# Patient Record
Sex: Male | Born: 1960 | Race: White | Hispanic: No | Marital: Single | Smoking: Former smoker
Health system: Southern US, Community
[De-identification: ages and names within clinical notes are randomized; demographics above are authoritative.]

## PROBLEM LIST (undated history)

## (undated) DIAGNOSIS — G4730 Sleep apnea, unspecified: Secondary | ICD-10-CM

## (undated) DIAGNOSIS — E785 Hyperlipidemia, unspecified: Secondary | ICD-10-CM

## (undated) DIAGNOSIS — E079 Disorder of thyroid, unspecified: Secondary | ICD-10-CM

## (undated) DIAGNOSIS — M549 Dorsalgia, unspecified: Secondary | ICD-10-CM

## (undated) DIAGNOSIS — I2699 Other pulmonary embolism without acute cor pulmonale: Secondary | ICD-10-CM

## (undated) DIAGNOSIS — I10 Essential (primary) hypertension: Secondary | ICD-10-CM

## (undated) DIAGNOSIS — F5101 Primary insomnia: Secondary | ICD-10-CM

## (undated) DIAGNOSIS — E213 Hyperparathyroidism, unspecified: Secondary | ICD-10-CM

## (undated) DIAGNOSIS — E119 Type 2 diabetes mellitus without complications: Secondary | ICD-10-CM

## (undated) DIAGNOSIS — Z8719 Personal history of other diseases of the digestive system: Secondary | ICD-10-CM

## (undated) DIAGNOSIS — Z8711 Personal history of peptic ulcer disease: Secondary | ICD-10-CM

## (undated) DIAGNOSIS — G894 Chronic pain syndrome: Secondary | ICD-10-CM

## (undated) HISTORY — DX: Essential (primary) hypertension: I10

## (undated) HISTORY — DX: Disorder of thyroid, unspecified: E07.9

## (undated) HISTORY — DX: Primary insomnia: F51.01

## (undated) HISTORY — DX: Sleep apnea, unspecified: G47.30

## (undated) HISTORY — DX: Chronic pain syndrome: G89.4

## (undated) HISTORY — PX: APPENDECTOMY: SHX54

## (undated) HISTORY — DX: Hyperparathyroidism, unspecified: E21.3

## (undated) HISTORY — PX: PARATHYROIDECTOMY: SHX19

## (undated) HISTORY — DX: Personal history of peptic ulcer disease: Z87.11

## (undated) HISTORY — PX: OTHER SURGICAL HISTORY: SHX169

## (undated) HISTORY — DX: Hyperlipidemia, unspecified: E78.5

## (undated) HISTORY — DX: Other pulmonary embolism without acute cor pulmonale: I26.99

## (undated) HISTORY — DX: Dorsalgia, unspecified: M54.9

## (undated) HISTORY — DX: Type 2 diabetes mellitus without complications: E11.9

---
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
## 1998-12-26 ENCOUNTER — Emergency Department (HOSPITAL_COMMUNITY): Admission: EM | Admit: 1998-12-26 | Discharge: 1998-12-26 | Payer: Self-pay | Admitting: Emergency Medicine

## 1998-12-26 ENCOUNTER — Encounter: Payer: Self-pay | Admitting: Emergency Medicine

## 2014-12-31 LAB — HM COLONOSCOPY

## 2018-09-11 DIAGNOSIS — R079 Chest pain, unspecified: Secondary | ICD-10-CM | POA: Diagnosis not present

## 2018-09-11 DIAGNOSIS — R0602 Shortness of breath: Secondary | ICD-10-CM

## 2018-09-14 ENCOUNTER — Ambulatory Visit (HOSPITAL_BASED_OUTPATIENT_CLINIC_OR_DEPARTMENT_OTHER)
Admission: RE | Admit: 2018-09-14 | Discharge: 2018-09-14 | Disposition: A | Discharge status: Home / Self Care | Payer: BLUE CROSS/BLUE SHIELD | Source: Ambulatory Visit | Attending: Cardiology | Admitting: Cardiology

## 2018-09-14 ENCOUNTER — Ambulatory Visit (INDEPENDENT_AMBULATORY_CARE_PROVIDER_SITE_OTHER): Payer: BLUE CROSS/BLUE SHIELD | Admitting: Cardiology

## 2018-09-14 DIAGNOSIS — E785 Hyperlipidemia, unspecified: Secondary | ICD-10-CM | POA: Insufficient documentation

## 2018-09-14 DIAGNOSIS — R9439 Abnormal result of other cardiovascular function study: Secondary | ICD-10-CM | POA: Diagnosis present

## 2018-09-14 DIAGNOSIS — I152 Hypertension secondary to endocrine disorders: Secondary | ICD-10-CM | POA: Insufficient documentation

## 2018-09-14 DIAGNOSIS — I10 Essential (primary) hypertension: Secondary | ICD-10-CM | POA: Diagnosis not present

## 2018-09-14 DIAGNOSIS — E119 Type 2 diabetes mellitus without complications: Secondary | ICD-10-CM | POA: Diagnosis not present

## 2018-09-14 NOTE — Progress Notes (Signed)
Cardiology Consultation:    Date:  09/14/2018   ID:  Sima Matas, DOB 1961-05-20, MRN [id number]  PCP:  Blane Ohara, MD  Cardiologist:  Gypsy Balsam, MD   Referring MD: Blane Ohara, MD   Chief Complaint  Patient presents with  . Abnormal Nuclear  I have abnormal stress test  History of Present Illness:    Russell Klein is a 57 y.o. male who is being seen today for the evaluation of abnormal stress test at the request of Cox, Kirsten, MD.  He has multiple risk factors for coronary artery disease that include hypertension diabetes dyslipidemia.  He is to smoke cigar however this past few years ago.  He did have a stress test with prompt stress test was the fact that he got short of breath very easily.  Stress test was done in form of Lexiscan and showed small area of ischemia involving midportion of the anterior wall.  Is here to talk about that.  He denies having any typical chest pain tightness squeezing pressure burning chest but gets tired very easily short of breath very easily.  Still is able to perform activity of daily living with no problems.  Never had any heart trouble.  Past Medical History:  Diagnosis Date  . Diabetes (HCC)   . Hyperlipidemia   . Hypertension   . Pulmonary embolism (HCC)   . Sleep apnea   . Thyroid disease       Current Medications: Current Meds  Medication Sig  . aspirin EC 81 MG tablet Take 81 mg by mouth daily.  Marland Kitchen atorvastatin (LIPITOR) 10 MG tablet Take 10 mg by mouth daily.  . Cholecalciferol (VITAMIN D3) 5000 units TABS Take 1 tablet by mouth daily.  Marland Kitchen FLUoxetine (PROZAC) 20 MG tablet Take 20 mg by mouth daily.  Marland Kitchen JARDIANCE 25 MG TABS tablet Take 1 tablet by mouth daily.  Marland Kitchen KOMBIGLYZE XR 2.03-999 MG TB24 Take 1 tablet by mouth 2 (two) times daily.  Marland Kitchen lisinopril (PRINIVIL,ZESTRIL) 20 MG tablet Take 20 mg by mouth daily.  . meloxicam (MOBIC) 15 MG tablet Take 15 mg by mouth daily.  . pantoprazole (PROTONIX) 40 MG tablet Take 40 mg by  mouth daily.  . pioglitazone (ACTOS) 15 MG tablet Take 15 mg by mouth daily.  . traZODone (DESYREL) 50 MG tablet Take 50 mg by mouth at bedtime.     Allergies:   Patient has no known allergies.   Social History   Socioeconomic History  . Marital status: Single    Spouse name: Not on file  . Number of children: Not on file  . Years of education: Not on file  . Highest education level: Not on file  Occupational History  . Not on file  Social Needs  . Financial resource strain: Not on file  . Food insecurity:    Worry: Not on file    Inability: Not on file  . Transportation needs:    Medical: Not on file    Non-medical: Not on file  Tobacco Use  . Smoking status: Former Smoker    Types: Cigars  . Smokeless tobacco: Never Used  Substance and Sexual Activity  . Alcohol use: Not Currently  . Drug use: Never  . Sexual activity: Not on file  Lifestyle  . Physical activity:    Days per week: Not on file    Minutes per session: Not on file  . Stress: Not on file  Relationships  . Social connections:  Talks on phone: Not on file    Gets together: Not on file    Attends religious service: Not on file    Active member of club or organization: Not on file    Attends meetings of clubs or organizations: Not on file    Relationship status: Not on file  Other Topics Concern  . Not on file  Social History Narrative  . Not on file     Family History: The patient's family history includes Cushing syndrome in his father; Diabetes in his mother; Stroke in his mother; Thyroid disease in his sister. ROS:   Please see the history of present illness.    All 14 point review of systems negative except as described per history of present illness.  EKGs/Labs/Other Studies Reviewed:    The following studies were reviewed today:  Stress test showed small region of mild reversible ischemia in the mid ventricle anterior wall left ventricular ejection fraction was 75% EKG:  EKG is   ordered today.  The ekg ordered today demonstrates   Recent Labs: No results found for requested labs within last 8760 hours.  Recent Lipid Panel No results found for: CHOL, TRIG, HDL, CHOLHDL, VLDL, LDLCALC, LDLDIRECT  Physical Exam:    VS:  BP (!) 156/72   Pulse 88   Resp 12   Ht 5\' 9"  (1.753 m)   Wt 296 lb 6.4 oz (134.4 kg)   BMI 43.77 kg/m     Wt Readings from Last 3 Encounters:  09/14/18 296 lb 6.4 oz (134.4 kg)     GEN:  Well nourished, well developed in no acute distress HEENT: Normal NECK: No JVD; No carotid bruits LYMPHATICS: No lymphadenopathy CARDIAC: RRR, no murmurs, no rubs, no gallops RESPIRATORY:  Clear to auscultation without rales, wheezing or rhonchi  ABDOMEN: Soft, non-tender, non-distended MUSCULOSKELETAL:  No edema; No deformity  SKIN: Warm and dry NEUROLOGIC:  Alert and oriented x 3 PSYCHIATRIC:  Normal affect   ASSESSMENT:    1. Abnormal stress test   2. Type 2 diabetes mellitus without complication, without long-term current use of insulin (HCC)   3. Essential hypertension   4. Dyslipidemia    PLAN:    In order of problems listed above:  1. Abnormal stress test.  We had a long discussion about what to do with the situation.  He clearly at high risk of having significant coronary artery disease his stress test showed only small area of ischemia but in his situation I recommended cardiac catheterization.  We also talked about options like medical therapy however with his atypical symptoms I would have difficult time to judge if medications are truly working on top of that taking into consideration all risk factors that he has the best way to assess his coronaries will be to perform cardiac catheterization.  Procedure was explained to him including all risk benefits as well as alternatives.  He agreed to proceed. 2. Type 2 diabetes followed by internal medicine team on appropriate medications including Jardiance will continue. 3. Essential  hypertension blood pressure well controlled continue present management. 4. Dyslipidemia he is taking statin however moderate intensity in the future he may require high intensity statin. 5. Snoring at night with stop breathing at night I strongly suspect he does have sleep apnea in the future most likely will require sleep study.   Medication Adjustments/Labs and Tests Ordered: Current medicines are reviewed at length with the patient today.  Concerns regarding medicines are outlined above.  No orders  of the defined types were placed in this encounter.  No orders of the defined types were placed in this encounter.   Signed, Georgeanna Lea, MD, [facility]. 09/14/2018 11:32 AM    [facility]

## 2018-09-14 NOTE — Patient Instructions (Addendum)
Medication Instructions:  Your physician recommends that you continue on your current medications as directed. Please refer to the Current Medication list given to you today.  If you need a refill on your cardiac medications before your next appointment, please call your pharmacy.   Lab work: Your physician recommends that you return for lab work today: CBC, BMP  If you have labs (blood work) drawn today and your tests are completely normal, you will receive your results only by: Marland Kitchen MyChart Message (if you have MyChart) OR . A paper copy in the mail If you have any lab test that is abnormal or we need to change your treatment, we will call you to review the results.  Testing/Procedures: A chest x-ray takes a picture of the organs and structures inside the chest, including the heart, lungs, and blood vessels. This test can show several things, including, whether the heart is enlarges; whether fluid is building up in the lungs; and whether pacemaker / defibrillator leads are still in place.     [facility] CARDIOVASCULAR DIVISION [facility] [place] [address], [address] [place] [address] Dept: [phone] Loc: [phone]  Syed Zukas  09/14/2018  You are scheduled for a Cardiac Catheterization on Tuesday, October 29 with Dr. Bryan Lemma.  1. Please arrive at the [facility] (Main Entrance A) at [facility]: [address] [place], [place] [address] at 7:00 AM (This time is two hours before your procedure to ensure your preparation). Free valet parking service is available.   Special note: Every effort is made to have your procedure done on time. Please understand that emergencies sometimes delay scheduled procedures.  2. Diet: Do not eat solid foods after midnight.  The patient may have clear liquids until 5am upon the day of the procedure.  3. Labs: You will need to have blood drawn today.  4. Medication instructions in  preparation for your procedure:   Contrast Allergy: No   Do not take Diabetes Med Kombligize XR on the day of the procedure and HOLD 48 HOURS AFTER THE PROCEDURE.   DO NOT TAKE Actos, and Jardiance on the day of the procedure.   On the morning of your procedure, take your Aspirin and any morning medicines NOT listed above.  You may use sips of water.  5. Plan for one night stay--bring personal belongings. 6. Bring a current list of your medications and current insurance cards. 7. You MUST have a responsible person to drive you home. 8. Someone MUST be with you the first 24 hours after you arrive home or your discharge will be delayed. 9. Please wear clothes that are easy to get on and off and wear slip-on shoes.  Thank you for allowing [place] to care for you!   -- [facility]  Follow-Up: At [facility], you and your health needs are our priority.  As part of our continuing mission to provide you with exceptional heart care, we have created designated Provider Care Teams.  These Care Teams include your primary Cardiologist (physician) and Advanced Practice Providers (APPs -  Physician Assistants and Nurse Practitioners) who all work together to provide you with the care you need, when you need it. You will need a follow up appointment in 1 months.  Please call our office 2 months in advance to schedule this appointment.  You may see No primary care provider on file. or another member of our [facility]  Team in [place]: Norman Herrlich, MD . Belva Crome, MD  Any Other Special Instructions Will Be Listed Below (If Applicable).   Chest X-Ray A chest X-ray is a painless test that uses radiation to create images of the structures inside of your chest. Chest X-rays are used to look for many health conditions, including heart failure, pneumonia, tuberculosis, rib fractures, breathing disorders, and cancer. They may be used to diagnose chest pain,  constant coughing, or trouble breathing. Tell a health care provider about:  Any allergies you have.  All medicines you are taking, including vitamins, herbs, eye drops, creams, and over-the-counter medicines.  Any surgeries you have had.  Any medical conditions you have.  Whether you are pregnant or may be pregnant. What are the risks? Getting a chest X-ray is a safe procedure. However, you will be exposed to a small amount of radiation. Being exposed to too much radiation over a lifetime can increase the risk of cancer. This risk is small, but it may occur if you have many X-rays throughout your life. What happens before the procedure?  You may be asked to remove glasses, jewelry, and any other metal objects.  You will be asked to undress from the waist up. You may be given a hospital gown to wear.  You may be asked to wear a protective lead apron to protect parts of your body from radiation. What happens during the procedure?  You will be asked to stand still as each picture is taken to get the best possible images.  You will be asked to take a deep breath and hold your breath for a few seconds.  The X-ray machine will create a picture of your chest using a tiny burst of radiation. This is painless.  More pictures may be taken from other angles. Typically, one picture will be taken while you face the X-ray camera, and another picture will be taken from the side while you stand. If you cannot stand, you may be asked to lie down. The procedure may vary among health care providers and hospitals. What happens after the procedure?  The X-ray(s) will be reviewed by your health care provider or an X-ray (radiology) specialist.  It is up to you to get your test results. Ask your health care provider, or the department that is doing the test, when your results will be ready.  Your health care provider will tell you if you need more tests or a follow-up exam. Keep all follow-up visits  as told by your health care provider. This is important. Summary  A chest X-ray is a safe, painless test that is used to examine the inside of the chest, heart, and lungs.  You will need to undress from the waist up and remove jewelry and metal objects before the procedure.  You will be exposed to a small amount of radiation during the procedure.  The X-ray machine will take one or more pictures of your chest while you remain as still as possible.  Later, a health care provider or specialist will review the test results with you. This information is not intended to replace advice given to you by your health care provider. Make sure you discuss any questions you have with your health care provider. Document Released: 01/03/2017 Document Revised: 01/03/2017 Document Reviewed: 01/03/2017 Elsevier Interactive Patient Education  2018 [employer].   Coronary Angiogram With Stent Coronary angiogram with stent placement is a procedure to widen or open a narrow blood vessel of the  heart (coronary artery). Arteries may become blocked by cholesterol buildup (plaques) in the lining of the wall. When a coronary artery becomes partially blocked, blood flow to that area decreases. This may lead to chest pain or a heart attack (myocardial infarction). A stent is a small piece of metal that looks like mesh or a spring. Stent placement may be done as treatment for a heart attack or right after a coronary angiogram in which a blocked artery is found. Let your health care provider know about:  Any allergies you have.  All medicines you are taking, including vitamins, herbs, eye drops, creams, and over-the-counter medicines.  Any problems you or family members have had with anesthetic medicines.  Any blood disorders you have.  Any surgeries you have had.  Any medical conditions you have.  Whether you are pregnant or may be pregnant. What are the risks? Generally, this is a safe procedure. However,  problems may occur, including:  Damage to the heart or its blood vessels.  A return of blockage.  Bleeding, infection, or bruising at the insertion site.  A collection of blood under the skin (hematoma) at the insertion site.  A blood clot in another part of the body.  Kidney injury.  Allergic reaction to the dye or contrast that is used.  Bleeding into the abdomen (retroperitoneal bleeding).  What happens before the procedure? Staying hydrated Follow instructions from your health care provider about hydration, which may include:  Up to 2 hours before the procedure - you may continue to drink clear liquids, such as water, clear fruit juice, black coffee, and plain tea.  Eating and drinking restrictions Follow instructions from your health care provider about eating and drinking, which may include:  8 hours before the procedure - stop eating heavy meals or foods such as meat, fried foods, or fatty foods.  6 hours before the procedure - stop eating light meals or foods, such as toast or cereal.  2 hours before the procedure - stop drinking clear liquids.  Ask your health care provider about:  Changing or stopping your regular medicines. This is especially important if you are taking diabetes medicines or blood thinners.  Taking medicines such as ibuprofen. These medicines can thin your blood. Do not take these medicines before your procedure if your health care provider instructs you not to. Generally, aspirin is recommended before a procedure of passing a small, thin tube (catheter) through a blood vessel and into the heart (cardiac catheterization).  What happens during the procedure?  An IV tube will be inserted into one of your veins.  You will be given one or more of the following: ? A medicine to help you relax (sedative). ? A medicine to numb the area where the catheter will be inserted into an artery (local anesthetic).  To reduce your risk of infection: ? Your  health care team will wash or sanitize their hands. ? Your skin will be washed with soap. ? Hair may be removed from the area where the catheter will be inserted.  Using a guide wire, the catheter will be inserted into an artery. The location may be in your groin, in your wrist, or in the fold of your arm (near your elbow).  A type of X-ray (fluoroscopy) will be used to help guide the catheter to the opening of the arteries in the heart.  A dye will be injected into the catheter, and X-rays will be taken. The dye will help to show where any  narrowing or blockages are located in the arteries.  A tiny wire will be guided to the blocked spot, and a balloon will be inflated to make the artery wider.  The stent will be expanded and will crush the plaques into the wall of the vessel. The stent will hold the area open and improve the blood flow. Most stents have a drug coating to reduce the risk of the stent narrowing over time.  The artery may be made wider using a drill, laser, or other tools to remove plaques.  When the blood flow is better, the catheter will be removed. The lining of the artery will grow over the stent, which stays where it was placed. This procedure may vary among health care providers and hospitals. What happens after the procedure?  If the procedure is done through the leg, you will be kept in bed lying flat for about 6 hours. You will be instructed to not bend and not cross your legs.  The insertion site will be checked frequently.  The pulse in your foot or wrist will be checked frequently.  You may have additional blood tests, X-rays, and a test that records the electrical activity of your heart (electrocardiogram, or ECG). This information is not intended to replace advice given to you by your health care provider. Make sure you discuss any questions you have with your health care provider. Document Released: 05/14/2003 Document Revised: 07/07/2016 Document Reviewed:  06/12/2016 Elsevier Interactive Patient Education  [employer].

## 2018-09-14 NOTE — H&P (View-Only) (Signed)
 Cardiology Consultation:    Date:  09/14/2018   ID:  Russell Klein, DOB 08/02/1961, MRN [id number]  PCP:  Cox, Kirsten, MD  Cardiologist:  Robert Krasowski, MD   Referring MD: Cox, Kirsten, MD   Chief Complaint  Patient presents with  . Abnormal Nuclear  I have abnormal stress test  History of Present Illness:    Russell Klein is a 57 y.o. male who is being seen today for the evaluation of abnormal stress test at the request of Cox, Kirsten, MD.  He has multiple risk factors for coronary artery disease that include hypertension diabetes dyslipidemia.  He is to smoke cigar however this past few years ago.  He did have a stress test with prompt stress test was the fact that he got short of breath very easily.  Stress test was done in form of Lexiscan and showed small area of ischemia involving midportion of the anterior wall.  Is here to talk about that.  He denies having any typical chest pain tightness squeezing pressure burning chest but gets tired very easily short of breath very easily.  Still is able to perform activity of daily living with no problems.  Never had any heart trouble.  Past Medical History:  Diagnosis Date  . Diabetes (HCC)   . Hyperlipidemia   . Hypertension   . Pulmonary embolism (HCC)   . Sleep apnea   . Thyroid disease       Current Medications: Current Meds  Medication Sig  . aspirin EC 81 MG tablet Take 81 mg by mouth daily.  . atorvastatin (LIPITOR) 10 MG tablet Take 10 mg by mouth daily.  . Cholecalciferol (VITAMIN D3) 5000 units TABS Take 1 tablet by mouth daily.  . FLUoxetine (PROZAC) 20 MG tablet Take 20 mg by mouth daily.  . JARDIANCE 25 MG TABS tablet Take 1 tablet by mouth daily.  . KOMBIGLYZE XR 2.03-999 MG TB24 Take 1 tablet by mouth 2 (two) times daily.  . lisinopril (PRINIVIL,ZESTRIL) 20 MG tablet Take 20 mg by mouth daily.  . meloxicam (MOBIC) 15 MG tablet Take 15 mg by mouth daily.  . pantoprazole (PROTONIX) 40 MG tablet Take 40 mg by  mouth daily.  . pioglitazone (ACTOS) 15 MG tablet Take 15 mg by mouth daily.  . traZODone (DESYREL) 50 MG tablet Take 50 mg by mouth at bedtime.     Allergies:   Patient has no known allergies.   Social History   Socioeconomic History  . Marital status: Single    Spouse name: Not on file  . Number of children: Not on file  . Years of education: Not on file  . Highest education level: Not on file  Occupational History  . Not on file  Social Needs  . Financial resource strain: Not on file  . Food insecurity:    Worry: Not on file    Inability: Not on file  . Transportation needs:    Medical: Not on file    Non-medical: Not on file  Tobacco Use  . Smoking status: Former Smoker    Types: Cigars  . Smokeless tobacco: Never Used  Substance and Sexual Activity  . Alcohol use: Not Currently  . Drug use: Never  . Sexual activity: Not on file  Lifestyle  . Physical activity:    Days per week: Not on file    Minutes per session: Not on file  . Stress: Not on file  Relationships  . Social connections:      Talks on phone: Not on file    Gets together: Not on file    Attends religious service: Not on file    Active member of club or organization: Not on file    Attends meetings of clubs or organizations: Not on file    Relationship status: Not on file  Other Topics Concern  . Not on file  Social History Narrative  . Not on file     Family History: The patient's family history includes Cushing syndrome in his father; Diabetes in his mother; Stroke in his mother; Thyroid disease in his sister. ROS:   Please see the history of present illness.    All 14 point review of systems negative except as described per history of present illness.  EKGs/Labs/Other Studies Reviewed:    The following studies were reviewed today:  Stress test showed small region of mild reversible ischemia in the mid ventricle anterior wall left ventricular ejection fraction was 75% EKG:  EKG is   ordered today.  The ekg ordered today demonstrates   Recent Labs: No results found for requested labs within last 8760 hours.  Recent Lipid Panel No results found for: CHOL, TRIG, HDL, CHOLHDL, VLDL, LDLCALC, LDLDIRECT  Physical Exam:    VS:  BP (!) 156/72   Pulse 88   Resp 12   Ht 5' 9" (1.753 m)   Wt 296 lb 6.4 oz (134.4 kg)   BMI 43.77 kg/m     Wt Readings from Last 3 Encounters:  09/14/18 296 lb 6.4 oz (134.4 kg)     GEN:  Well nourished, well developed in no acute distress HEENT: Normal NECK: No JVD; No carotid bruits LYMPHATICS: No lymphadenopathy CARDIAC: RRR, no murmurs, no rubs, no gallops RESPIRATORY:  Clear to auscultation without rales, wheezing or rhonchi  ABDOMEN: Soft, non-tender, non-distended MUSCULOSKELETAL:  No edema; No deformity  SKIN: Warm and dry NEUROLOGIC:  Alert and oriented x 3 PSYCHIATRIC:  Normal affect   ASSESSMENT:    1. Abnormal stress test   2. Type 2 diabetes mellitus without complication, without long-term current use of insulin (HCC)   3. Essential hypertension   4. Dyslipidemia    PLAN:    In order of problems listed above:  1. Abnormal stress test.  We had a long discussion about what to do with the situation.  He clearly at high risk of having significant coronary artery disease his stress test showed only small area of ischemia but in his situation I recommended cardiac catheterization.  We also talked about options like medical therapy however with his atypical symptoms I would have difficult time to judge if medications are truly working on top of that taking into consideration all risk factors that he has the best way to assess his coronaries will be to perform cardiac catheterization.  Procedure was explained to him including all risk benefits as well as alternatives.  He agreed to proceed. 2. Type 2 diabetes followed by internal medicine team on appropriate medications including Jardiance will continue. 3. Essential  hypertension blood pressure well controlled continue present management. 4. Dyslipidemia he is taking statin however moderate intensity in the future he may require high intensity statin. 5. Snoring at night with stop breathing at night I strongly suspect he does have sleep apnea in the future most likely will require sleep study.   Medication Adjustments/Labs and Tests Ordered: Current medicines are reviewed at length with the patient today.  Concerns regarding medicines are outlined above.  No orders   of the defined types were placed in this encounter.  No orders of the defined types were placed in this encounter.   Signed, Robert J. Krasowski, MD, FACC. 09/14/2018 11:32 AM    [facility] 

## 2018-09-15 LAB — BASIC METABOLIC PANEL
BUN / CREAT RATIO: 17 (ref 9–20)
BUN: 16 mg/dL (ref 6–24)
CHLORIDE: 106 mmol/L (ref 96–106)
CO2: 22 mmol/L (ref 20–29)
Calcium: 10.6 mg/dL — ABNORMAL HIGH (ref 8.7–10.2)
Creatinine, Ser: 0.92 mg/dL (ref 0.76–1.27)
GFR calc non Af Amer: 93 mL/min/{1.73_m2} (ref >59)
GFR, EST AFRICAN AMERICAN: 107 mL/min/{1.73_m2} (ref >59)
GLUCOSE: 128 mg/dL — AB (ref 65–99)
POTASSIUM: 4.6 mmol/L (ref 3.5–5.2)
SODIUM: 142 mmol/L (ref 134–144)

## 2018-09-15 LAB — CBC
Hematocrit: 43.7 % (ref 37.5–51.0)
Hemoglobin: 14.9 g/dL (ref 13.0–17.7)
MCH: 30.8 pg (ref 26.6–33.0)
MCHC: 34.1 g/dL (ref 31.5–35.7)
MCV: 91 fL (ref 79–97)
PLATELETS: 292 10*3/uL (ref 150–450)
RBC: 4.83 x10E6/uL (ref 4.14–5.80)
RDW: 15.2 % (ref 12.3–15.4)
WBC: 9.1 10*3/uL (ref 3.4–10.8)

## 2018-09-17 ENCOUNTER — Telehealth: Payer: Self-pay | Admitting: *Deleted

## 2018-09-17 DIAGNOSIS — R0600 Dyspnea, unspecified: Secondary | ICD-10-CM | POA: Clinically undetermined

## 2018-09-17 DIAGNOSIS — R0609 Other forms of dyspnea: Secondary | ICD-10-CM | POA: Clinically undetermined

## 2018-09-17 NOTE — Telephone Encounter (Signed)
Pt contacted pre-catheterization scheduled at [facility] for: Tuesday September 18, 2018 9 AM  Verified arrival time and place: [facility] Main Entrance A at: 7 AM  No solid food after midnight prior to cath, clear liquids until 5 AM day of procedure. Contrast allergy: no  Hold: Kombiglyze-day of procedure and 48 hours post procedure.  Actos-AM of procedure. Jardiance -AM of procedure.  Except hold medications AM meds can be  taken pre-cath with sip of water including: ASA 81 mg  Confirmed patient has responsible person to drive home post procedure and for 24 hours after you arrive home: yes

## 2018-09-18 ENCOUNTER — Encounter (HOSPITAL_COMMUNITY)
Admission: RE | Disposition: A | Discharge status: Home / Self Care | Payer: Self-pay | Source: Ambulatory Visit | Attending: Cardiology

## 2018-09-18 ENCOUNTER — Encounter (HOSPITAL_COMMUNITY): Payer: Self-pay | Admitting: Cardiology

## 2018-09-18 ENCOUNTER — Ambulatory Visit (HOSPITAL_COMMUNITY)
Admission: RE | Admit: 2018-09-18 | Discharge: 2018-09-18 | Disposition: A | Discharge status: Home / Self Care | Payer: BLUE CROSS/BLUE SHIELD | Source: Ambulatory Visit | Attending: Cardiology | Admitting: Cardiology

## 2018-09-18 DIAGNOSIS — I471 Supraventricular tachycardia: Secondary | ICD-10-CM | POA: Diagnosis not present

## 2018-09-18 DIAGNOSIS — E119 Type 2 diabetes mellitus without complications: Secondary | ICD-10-CM

## 2018-09-18 DIAGNOSIS — G4730 Sleep apnea, unspecified: Secondary | ICD-10-CM | POA: Insufficient documentation

## 2018-09-18 DIAGNOSIS — F17290 Nicotine dependence, other tobacco product, uncomplicated: Secondary | ICD-10-CM | POA: Diagnosis not present

## 2018-09-18 DIAGNOSIS — E785 Hyperlipidemia, unspecified: Secondary | ICD-10-CM | POA: Insufficient documentation

## 2018-09-18 DIAGNOSIS — Z86711 Personal history of pulmonary embolism: Secondary | ICD-10-CM | POA: Diagnosis not present

## 2018-09-18 DIAGNOSIS — E1159 Type 2 diabetes mellitus with other circulatory complications: Secondary | ICD-10-CM | POA: Diagnosis present

## 2018-09-18 DIAGNOSIS — R9439 Abnormal result of other cardiovascular function study: Secondary | ICD-10-CM | POA: Insufficient documentation

## 2018-09-18 DIAGNOSIS — Z7982 Long term (current) use of aspirin: Secondary | ICD-10-CM | POA: Insufficient documentation

## 2018-09-18 DIAGNOSIS — I97790 Other intraoperative cardiac functional disturbances during cardiac surgery: Secondary | ICD-10-CM | POA: Diagnosis not present

## 2018-09-18 DIAGNOSIS — R0609 Other forms of dyspnea: Secondary | ICD-10-CM

## 2018-09-18 DIAGNOSIS — I10 Essential (primary) hypertension: Secondary | ICD-10-CM | POA: Diagnosis present

## 2018-09-18 DIAGNOSIS — R0600 Dyspnea, unspecified: Secondary | ICD-10-CM | POA: Clinically undetermined

## 2018-09-18 DIAGNOSIS — I152 Hypertension secondary to endocrine disorders: Secondary | ICD-10-CM | POA: Diagnosis present

## 2018-09-18 HISTORY — PX: LEFT HEART CATH AND CORONARY ANGIOGRAPHY: CATH118249

## 2018-09-18 LAB — GLUCOSE, CAPILLARY: Glucose-Capillary: 140 mg/dL — ABNORMAL HIGH (ref 70–99)

## 2018-09-18 SURGERY — LEFT HEART CATH AND CORONARY ANGIOGRAPHY
Anesthesia: LOCAL

## 2018-09-18 MED ORDER — MIDAZOLAM HCL 2 MG/2ML IJ SOLN
INTRAMUSCULAR | Status: DC | PRN
  Administered 2018-09-18: 1 mg via INTRAVENOUS

## 2018-09-18 MED ORDER — HEPARIN (PORCINE) IN NACL 1000-0.9 UT/500ML-% IV SOLN
INTRAVENOUS | Status: DC | PRN
  Administered 2018-09-18 (×2): 500 mL

## 2018-09-18 MED ORDER — LIDOCAINE HCL (PF) 1 % IJ SOLN
INTRAMUSCULAR | Status: AC
  Filled 2018-09-18: qty 30

## 2018-09-18 MED ORDER — SODIUM CHLORIDE 0.9 % IV SOLN: 250.0000 mL | INTRAVENOUS | Status: DC | PRN

## 2018-09-18 MED ORDER — SODIUM CHLORIDE 0.9% FLUSH: 3.0000 mL | INTRAVENOUS | Status: DC | PRN

## 2018-09-18 MED ORDER — ACETAMINOPHEN 325 MG PO TABS: 650.0000 mg | ORAL_TABLET | ORAL | Status: DC | PRN

## 2018-09-18 MED ORDER — SODIUM CHLORIDE 0.9 % WEIGHT BASED INFUSION: 1.0000 mL/kg/h | INTRAVENOUS | Status: DC

## 2018-09-18 MED ORDER — SODIUM CHLORIDE 0.9% FLUSH: 3.0000 mL | Freq: Two times a day (BID) | INTRAVENOUS | Status: DC

## 2018-09-18 MED ORDER — MIDAZOLAM HCL 2 MG/2ML IJ SOLN
INTRAMUSCULAR | Status: AC
  Filled 2018-09-18: qty 2

## 2018-09-18 MED ORDER — HEPARIN (PORCINE) IN NACL 1000-0.9 UT/500ML-% IV SOLN
INTRAVENOUS | Status: AC
  Filled 2018-09-18: qty 1000

## 2018-09-18 MED ORDER — NITROGLYCERIN 1 MG/10 ML FOR IR/CATH LAB
INTRA_ARTERIAL | Status: DC | PRN
  Administered 2018-09-18: 200 ug via INTRACORONARY

## 2018-09-18 MED ORDER — VERAPAMIL HCL 2.5 MG/ML IV SOLN
INTRAVENOUS | Status: DC | PRN
  Administered 2018-09-18: 10 mL via INTRA_ARTERIAL

## 2018-09-18 MED ORDER — FENTANYL CITRATE (PF) 100 MCG/2ML IJ SOLN
INTRAMUSCULAR | Status: DC | PRN
  Administered 2018-09-18: 25 ug via INTRAVENOUS

## 2018-09-18 MED ORDER — ONDANSETRON HCL 4 MG/2ML IJ SOLN: 4.0000 mg | Freq: Four times a day (QID) | INTRAMUSCULAR | Status: DC | PRN

## 2018-09-18 MED ORDER — HEPARIN SODIUM (PORCINE) 1000 UNIT/ML IJ SOLN
INTRAMUSCULAR | Status: DC | PRN
  Administered 2018-09-18: 6000 [IU] via INTRAVENOUS

## 2018-09-18 MED ORDER — VERAPAMIL HCL 2.5 MG/ML IV SOLN
INTRAVENOUS | Status: AC
  Filled 2018-09-18: qty 2

## 2018-09-18 MED ORDER — ASPIRIN 81 MG PO CHEW: 81.0000 mg | CHEWABLE_TABLET | ORAL | Status: DC

## 2018-09-18 MED ORDER — NITROGLYCERIN 1 MG/10 ML FOR IR/CATH LAB
INTRA_ARTERIAL | Status: AC
  Filled 2018-09-18: qty 10

## 2018-09-18 MED ORDER — FENTANYL CITRATE (PF) 100 MCG/2ML IJ SOLN
INTRAMUSCULAR | Status: AC
  Filled 2018-09-18: qty 2

## 2018-09-18 MED ORDER — SODIUM CHLORIDE 0.9 % IV SOLN: INTRAVENOUS | Status: DC

## 2018-09-18 MED ORDER — IOHEXOL 350 MG/ML SOLN
INTRAVENOUS | Status: DC | PRN
  Administered 2018-09-18: 140 mL

## 2018-09-18 MED ORDER — SODIUM CHLORIDE 0.9 % WEIGHT BASED INFUSION
3.0000 mL/kg/h | INTRAVENOUS | Status: AC
  Administered 2018-09-18: 3 mL/kg/h via INTRAVENOUS

## 2018-09-18 MED ORDER — LIDOCAINE HCL (PF) 1 % IJ SOLN
INTRAMUSCULAR | Status: DC | PRN
  Administered 2018-09-18: 2 mL

## 2018-09-18 SURGICAL SUPPLY — 15 items
CATH INFINITI 5FR ANG PIGTAIL (CATHETERS) ×1 IMPLANT
CATH LAUNCHER 5F 3DRIGHT (CATHETERS) IMPLANT
CATH LAUNCHER 5F EBU3.5 (CATHETERS) ×1 IMPLANT
CATH OPTITORQUE TIG 4.0 5F (CATHETERS) ×1 IMPLANT
CATHETER LAUNCHER 5F 3DRIGHT (CATHETERS) ×2
DEVICE RAD COMP TR BAND LRG (VASCULAR PRODUCTS) ×1 IMPLANT
GLIDESHEATH SLEND A-KIT 6F 22G (SHEATH) ×1 IMPLANT
GLIDESHEATH SLEND SS 6F .021 (SHEATH) IMPLANT
GUIDEWIRE INQWIRE 1.5J.035X260 (WIRE) IMPLANT
INQWIRE 1.5J .035X260CM (WIRE) ×2
KIT HEART LEFT (KITS) ×2 IMPLANT
PACK CARDIAC CATHETERIZATION (CUSTOM PROCEDURE TRAY) ×2 IMPLANT
SYR MEDRAD MARK V 150ML (SYRINGE) ×2 IMPLANT
TRANSDUCER W/STOPCOCK (MISCELLANEOUS) ×2 IMPLANT
TUBING CIL FLEX 10 FLL-RA (TUBING) ×2 IMPLANT

## 2018-09-18 NOTE — Interval H&P Note (Signed)
History and Physical Interval Note:  09/18/2018 9:09 AM  Russell Klein  has presented today for surgery, with the diagnosis of ABNORMAL STRESS TEST.  The various methods of treatment have been discussed with the patient and family. After consideration of risks, benefits and other options for treatment, the patient has consented to  Procedure(s): LEFT HEART CATH AND CORONARY ANGIOGRAPHY (N/A) with possible PERCUTANEOUS CORONARY INTERVENTION as a surgical intervention .    The patient's history has been reviewed, patient examined, no change in status, stable for surgery.  I have reviewed the patient's chart and labs.  Questions were answered to the patient's satisfaction.    Cath Lab Visit (complete for each Cath Lab visit)  Clinical Evaluation Leading to the Procedure:   ACS: No.  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

## 2018-09-18 NOTE — Discharge Instructions (Signed)

## 2018-10-16 ENCOUNTER — Ambulatory Visit: Payer: BLUE CROSS/BLUE SHIELD | Admitting: Cardiology

## 2018-10-16 ENCOUNTER — Encounter: Payer: Self-pay | Admitting: Cardiology

## 2018-10-16 VITALS — BP 126/64 | HR 68 | Ht 69.0 in | Wt 297.1 lb

## 2018-10-16 DIAGNOSIS — E119 Type 2 diabetes mellitus without complications: Secondary | ICD-10-CM

## 2018-10-16 DIAGNOSIS — R9439 Abnormal result of other cardiovascular function study: Secondary | ICD-10-CM | POA: Diagnosis not present

## 2018-10-16 DIAGNOSIS — E785 Hyperlipidemia, unspecified: Secondary | ICD-10-CM

## 2018-10-16 DIAGNOSIS — I10 Essential (primary) hypertension: Secondary | ICD-10-CM | POA: Diagnosis not present

## 2018-10-16 NOTE — Progress Notes (Signed)
Cardiology Office Note:    Date:  10/16/2018   ID:  Russell Klein, DOB 03-18-1961, MRN [id number]  PCP:  Blane Ohara, MD  Cardiologist:  Gypsy Balsam, MD    Referring MD: Blane Ohara, MD   Chief Complaint  Patient presents with  . 1 month follow up  Doing great  History of Present Illness:    Russell Klein is a 57 y.o. male who was referred to me because of mildly abnormal stress test.  Because of his significant risk factors namely diabetes hypertension dyslipidemia he was referred for cardiac catheterization cardiac catheterization was done and showed normal coronaries.  Overall he is doing well obviously very happy with the results of his cardiac catheterization denies have any chest pain tightness squeezing pressure burning chest shortness of breath with exertion such as usual I suspect this problem is multifactorial clearly weight play some significant role here.  He also does have some diastolic dysfunction.  Past Medical History:  Diagnosis Date  . Diabetes (HCC)   . Hyperlipidemia   . Hypertension   . Pulmonary embolism (HCC)   . Sleep apnea   . Thyroid disease       Current Medications: Current Meds  Medication Sig  . aspirin EC 81 MG tablet Take 81 mg by mouth daily.  Marland Kitchen atorvastatin (LIPITOR) 10 MG tablet Take 10 mg by mouth daily.  . Cholecalciferol (VITAMIN D3) 5000 units TABS Take 1 tablet by mouth daily.  Marland Kitchen FLUoxetine (PROZAC) 20 MG tablet Take 20 mg by mouth daily.  Marland Kitchen JARDIANCE 25 MG TABS tablet Take 1 tablet by mouth daily.  Marland Kitchen KOMBIGLYZE XR 2.03-999 MG TB24 Take 1 tablet by mouth 2 (two) times daily.  Marland Kitchen lisinopril (PRINIVIL,ZESTRIL) 20 MG tablet Take 20 mg by mouth daily.  . meloxicam (MOBIC) 15 MG tablet Take 15 mg by mouth daily.  . nitroGLYCERIN (NITROSTAT) 0.4 MG SL tablet Place 0.4 mg under the tongue every 5 (five) minutes as needed for chest pain.   . pantoprazole (PROTONIX) 40 MG tablet Take 40 mg by mouth daily.  . pioglitazone (ACTOS) 15 MG  tablet Take 15 mg by mouth daily.  . traZODone (DESYREL) 50 MG tablet Take 50 mg by mouth at bedtime.     Allergies:   Patient has no known allergies.   Social History   Socioeconomic History  . Marital status: Single    Spouse name: Not on file  . Number of children: Not on file  . Years of education: Not on file  . Highest education level: Not on file  Occupational History  . Not on file  Social Needs  . Financial resource strain: Not on file  . Food insecurity:    Worry: Not on file    Inability: Not on file  . Transportation needs:    Medical: Not on file    Non-medical: Not on file  Tobacco Use  . Smoking status: Former Smoker    Types: Cigars  . Smokeless tobacco: Never Used  Substance and Sexual Activity  . Alcohol use: Not Currently  . Drug use: Never  . Sexual activity: Not on file  Lifestyle  . Physical activity:    Days per week: Not on file    Minutes per session: Not on file  . Stress: Not on file  Relationships  . Social connections:    Talks on phone: Not on file    Gets together: Not on file    Attends religious service: Not on file  Active member of club or organization: Not on file    Attends meetings of clubs or organizations: Not on file    Relationship status: Not on file  Other Topics Concern  . Not on file  Social History Narrative  . Not on file     Family History: The patient's family history includes Cushing syndrome in his father; Diabetes in his mother; Stroke in his mother; Thyroid disease in his sister. ROS:   Please see the history of present illness.    All 14 point review of systems negative except as described per history of present illness  EKGs/Labs/Other Studies Reviewed:     Cardiac catheterization 09/01/2018  The left ventricular systolic function is normal. The left ventricular ejection fraction is 55-65% by visual estimate.  LV end diastolic pressure is moderately elevated.  Angiographically normal coronary  arteries with codominant system.     Recent Labs: 09/14/2018: BUN 16; Creatinine, Ser 0.92; Hemoglobin 14.9; Platelets 292; Potassium 4.6; Sodium 142  Recent Lipid Panel No results found for: CHOL, TRIG, HDL, CHOLHDL, VLDL, LDLCALC, LDLDIRECT  Physical Exam:    VS:  BP 126/64   Pulse 68   Ht 5\' 9"  (1.753 m)   Wt 297 lb 1.9 oz (134.8 kg)   SpO2 96%   BMI 43.88 kg/m     Wt Readings from Last 3 Encounters:  10/16/18 297 lb 1.9 oz (134.8 kg)  09/18/18 295 lb (133.8 kg)  09/14/18 296 lb 6.4 oz (134.4 kg)     GEN:  Well nourished, well developed in no acute distress HEENT: Normal NECK: No JVD; No carotid bruits LYMPHATICS: No lymphadenopathy CARDIAC: RRR, no murmurs, no rubs, no gallops RESPIRATORY:  Clear to auscultation without rales, wheezing or rhonchi  ABDOMEN: Soft, non-tender, non-distended MUSCULOSKELETAL:  No edema; No deformity  SKIN: Warm and dry LOWER EXTREMITIES: no swelling NEUROLOGIC:  Alert and oriented x 3 PSYCHIATRIC:  Normal affect   ASSESSMENT:    1. Abnormal stress test   2. Essential hypertension   3. Type 2 diabetes mellitus without complication, without long-term current use of insulin (HCC)   4. Dyslipidemia    PLAN:    In order of problems listed above:  1. Abnormal stress test with cardiac catheterization showing normal coronaries the key will be now to modify risk factors for coronary artery disease I review all medications with him he will continue taking aspirin and statin as well as all appropriate medication for his diabetes. 2. Essential hypertension blood pressure well controlled continue present management. 3. Dyslipidemia he is on statin which we will continue.  His last fasting lipid profile is acceptable. 4. Type 2 diabetes on appropriate medications which I will continue. 5.  Diastolic dysfunction.  He appears to be asymptomatic right now that became a problem in the future he may require some diuresis.  Medication  Adjustments/Labs and Tests Ordered: Current medicines are reviewed at length with the patient today.  Concerns regarding medicines are outlined above.  No orders of the defined types were placed in this encounter.  Medication changes: No orders of the defined types were placed in this encounter.   Signed, Georgeanna Leaobert J. Krasowski, MD, [facility] 10/16/2018 2:14 PM    [facility]

## 2018-10-16 NOTE — Patient Instructions (Signed)
Medication Instructions:  Your physician recommends that you continue on your current medications as directed. Please refer to the Current Medication list given to you today.  If you need a refill on your cardiac medications before your next appointment, please call your pharmacy.   Lab work: None ordred If you have labs (blood work) drawn today and your tests are completely normal, you will receive your results only by: Marland Kitchen. MyChart Message (if you have MyChart) OR . A paper copy in the mail If you have any lab test that is abnormal or we need to change your treatment, we will call you to review the results.  Testing/Procedures: None ordered  Follow-Up: At [facility], you and your health needs are our priority.  As part of our continuing mission to provide you with exceptional heart care, we have created designated Provider Care Teams.  These Care Teams include your primary Cardiologist (physician) and Advanced Practice Providers (APPs -  Physician Assistants and Nurse Practitioners) who all work together to provide you with the care you need, when you need it. You will need a follow up appointment in 6 months.  Please call our office 2 months in advance to schedule this appointment.  You may see Gypsy Balsamobert Krasowski, MD or another member of our [facility] Provider Team in [place]: Norman HerrlichBrian Munley, MD . Belva Cromeajan Revankar, MD  Any Other Special Instructions Will Be Listed Below (If Applicable).

## 2018-12-05 DIAGNOSIS — E559 Vitamin D deficiency, unspecified: Secondary | ICD-10-CM | POA: Insufficient documentation

## 2018-12-05 DIAGNOSIS — E213 Hyperparathyroidism, unspecified: Secondary | ICD-10-CM | POA: Insufficient documentation

## 2019-12-03 LAB — PSA: PSA: 0.6

## 2019-12-18 IMAGING — CR DG CHEST 2V
2 series · 2 of 2 positions shown · non-contrast
Comparison: PA and lateral chest 04/18/2014.

CLINICAL DATA: Chest pain and shortness of breath for a few weeks.

EXAM:
CHEST - 2 VIEW

[w chest pa]
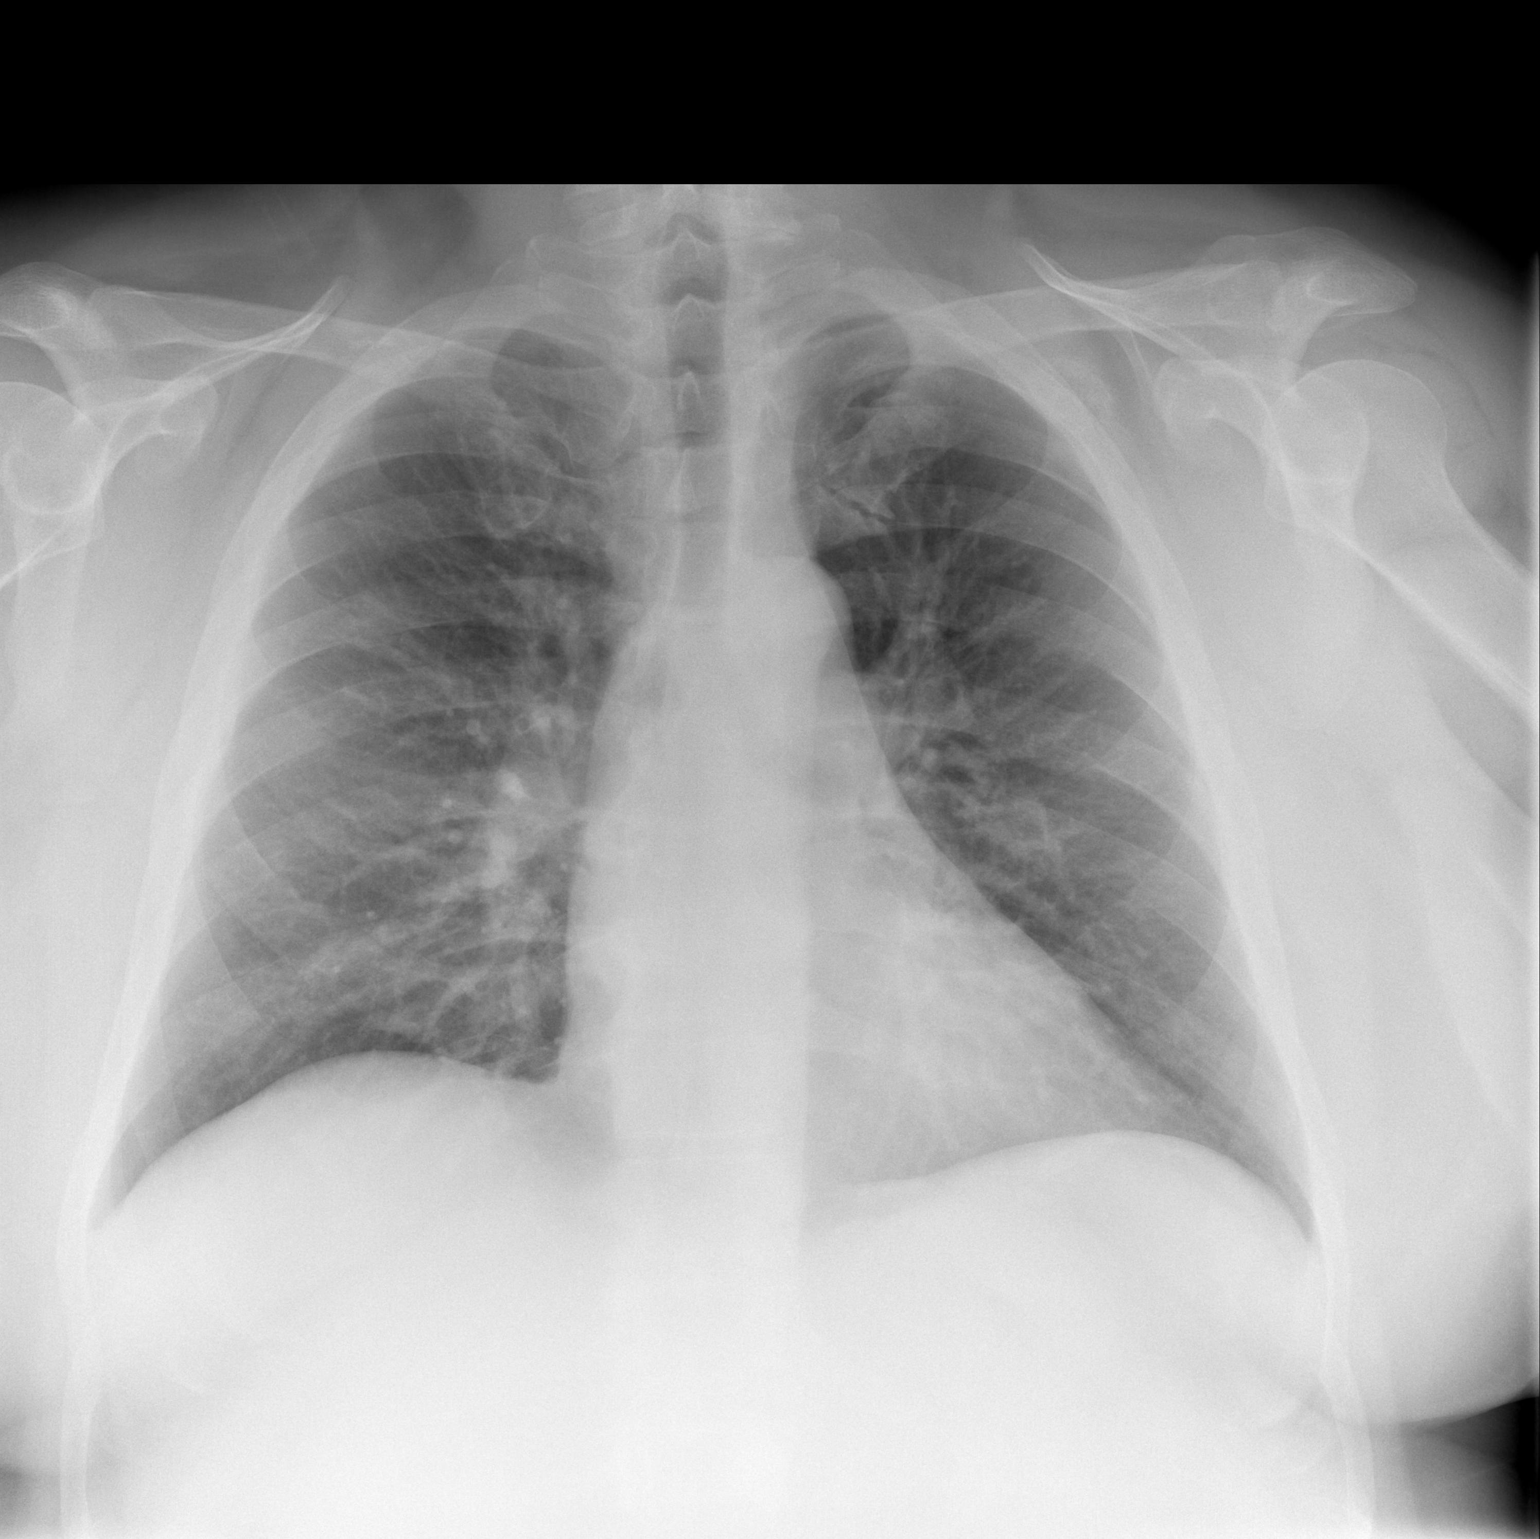

[w chest lat]
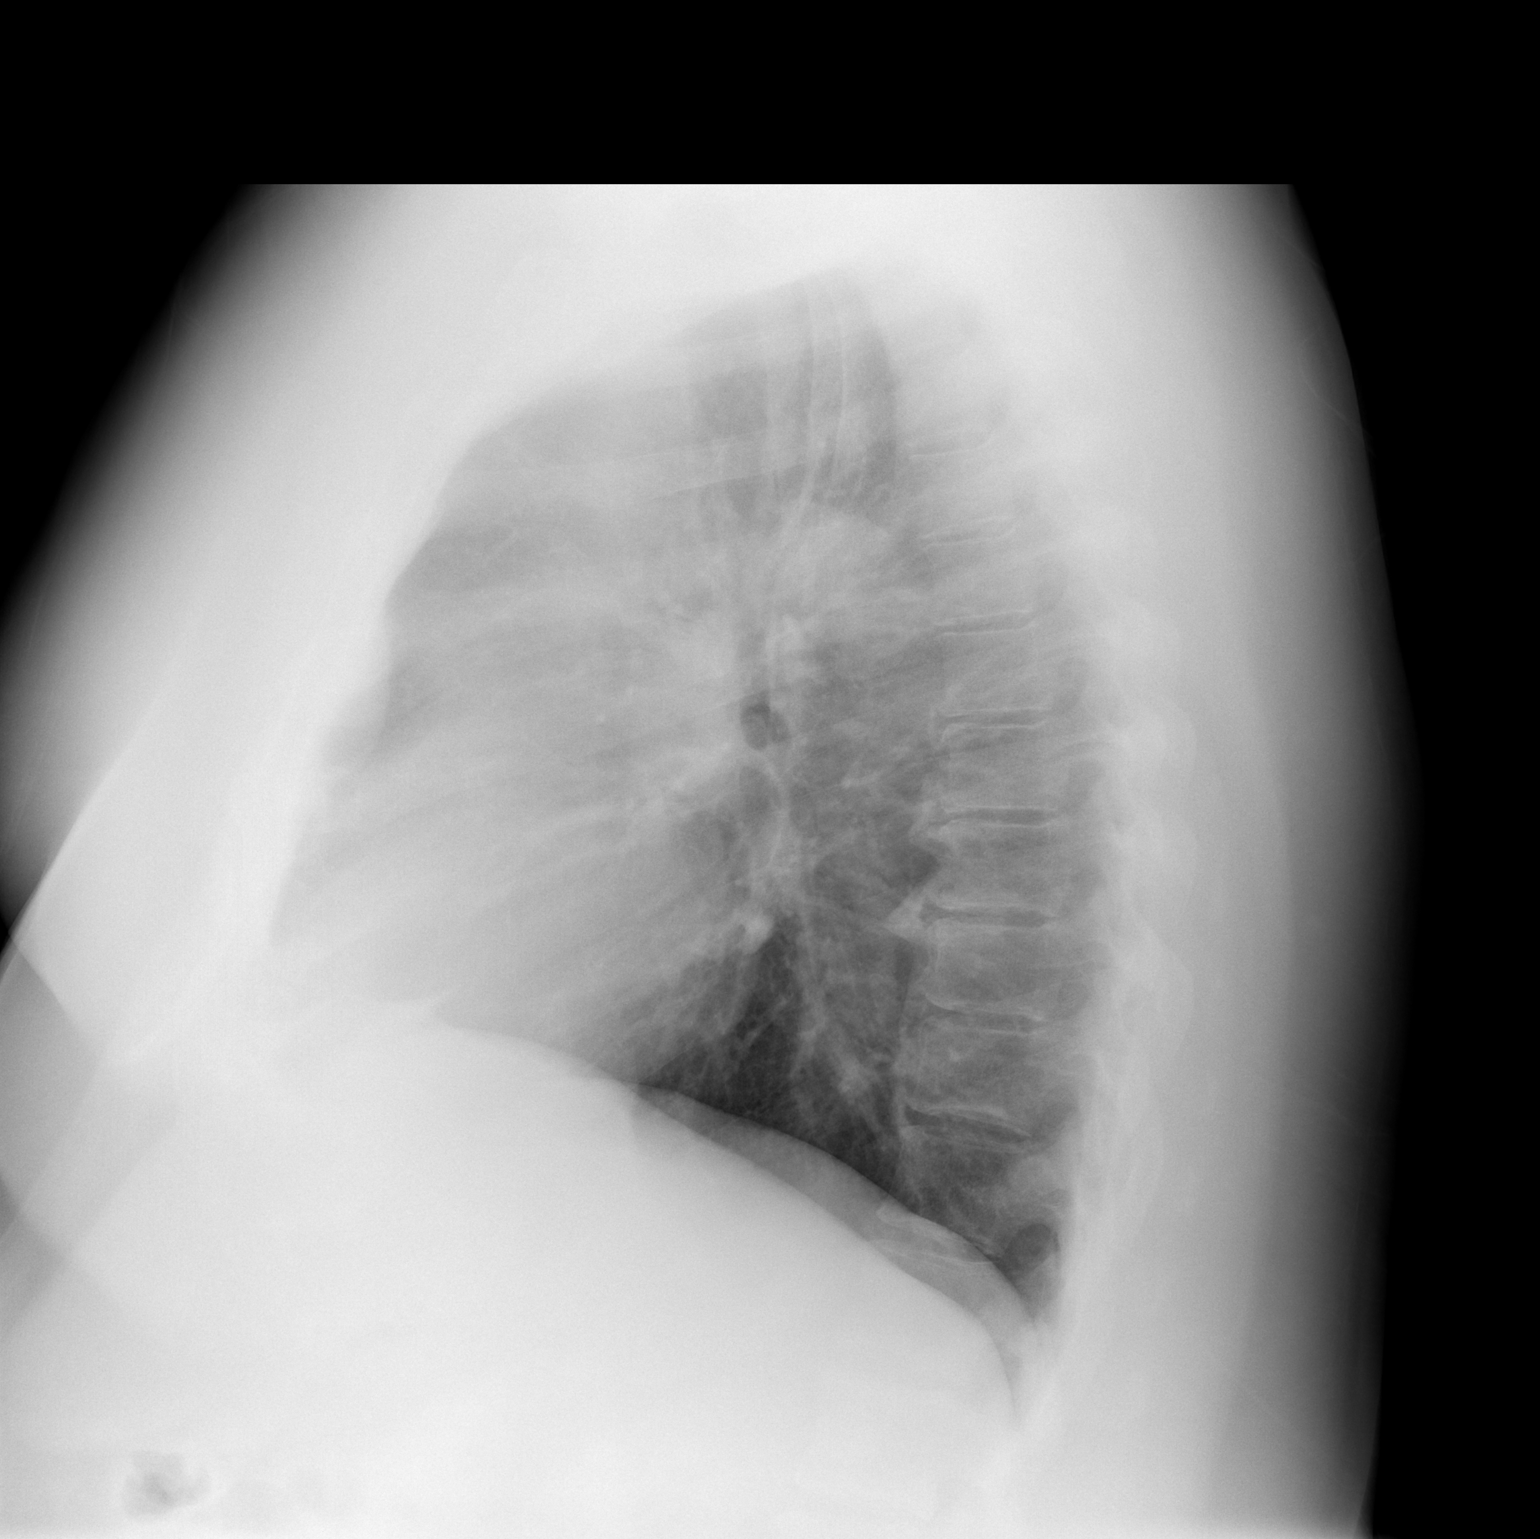

[2 of 2 positions shown; findings below may reference images not displayed]

FINDINGS: The lungs are clear. Heart size is normal. No pneumothorax or
pleural fluid. No acute or focal bony abnormality.
IMPRESSION: No acute disease.

## 2020-01-06 ENCOUNTER — Other Ambulatory Visit: Payer: Self-pay | Admitting: Family Medicine

## 2020-01-13 ENCOUNTER — Other Ambulatory Visit: Payer: Self-pay | Admitting: Family Medicine

## 2020-01-16 ENCOUNTER — Other Ambulatory Visit: Payer: Self-pay | Admitting: Family Medicine

## 2020-01-20 ENCOUNTER — Telehealth: Payer: Self-pay

## 2020-01-20 NOTE — Telephone Encounter (Signed)
Russell Klein called requesting a referral for Russell Klein.  He is experiencing problems with urination.  He has decreased urine flow.  He would like to go to [facility].

## 2020-01-21 ENCOUNTER — Other Ambulatory Visit: Payer: Self-pay | Admitting: Family Medicine

## 2020-01-21 DIAGNOSIS — R350 Frequency of micturition: Secondary | ICD-10-CM

## 2020-01-21 NOTE — Telephone Encounter (Signed)
Referral to [facility] ok. Sent to [facility].

## 2020-01-27 ENCOUNTER — Other Ambulatory Visit: Payer: Self-pay | Admitting: Family Medicine

## 2020-01-27 MED ORDER — JANUMET 50-1000 MG PO TABS: 1.0000 | ORAL_TABLET | Freq: Two times a day (BID) | ORAL | 0 refills | Status: DC

## 2020-01-28 ENCOUNTER — Telehealth: Payer: Self-pay

## 2020-01-28 NOTE — Telephone Encounter (Signed)
LVM to call us back to let him know about the change of the medication kombiglyze to janumet due to insurance noncoverage.

## 2020-01-30 ENCOUNTER — Encounter: Payer: Self-pay | Admitting: Family Medicine

## 2020-01-30 NOTE — Assessment & Plan Note (Addendum)
Poorly controlled.  Stop phentermine due to sinus tachycardia.  Patient needs to work on eating healthier and exercising.  This is limited due to his knee pain however he needs to continue to try.

## 2020-01-30 NOTE — Assessment & Plan Note (Addendum)
Blood pressure has dropped. Continue to hold lisinopril.  Continue to work on eating a healthy diet and exercise.  Labs drawn today.  EKG shows sinus tachycardia.  Discontinue phentermine.

## 2020-01-30 NOTE — Assessment & Plan Note (Signed)
Well controlled.  ?No changes to medicines.  ?Continue to work on eating a healthy diet and exercise.  ?Labs drawn today.  ?

## 2020-01-30 NOTE — Progress Notes (Signed)
Subjective:  Patient ID: Russell Klein, male    DOB: 06/09/1961  Age: 59 y.o. MRN: [id number]  Chief Complaint  Patient presents with  . Hypertension  . Diabetes  . Hyperlipidemia    HPI  Pt presents with hyperlipidemia.  Current treatment includes Lipitor.  Compliance with treatment has been good; he takes his medication as directed, maintains his low cholesterol diet, and follows up as directed.  He denies experiencing any hypercholesterolemia related symptoms.     Type 2 diabetes mellitus with diabetic nephropathy details; specifically, this is type 2, non-insulin requiring diabetes without complications.  Compliance with treatment has been good; he takes his medication as directed, maintains his diet and exercise regimen, follows up as directed, and is keeping a glucose diary.  Patient's diabetes was first diagnosed 6 years ago.  He follows a prescribed diet.  Current meds include an oral hypoglycemic ( Kombiglyze XR, Jardiance, glipizide and pioglitazone. ).  He reports home blood glucose readings 2-3 times per day.  200-300s. Then will drop to 88 same day. He stopped kombiglyze xr due to cost. I changed to [employer].  In regard to preventative care, he performs foot self-exams daily and his last ophthalmology exam was in 07/2019.      Pt presents for follow up of hypertension.  His current cardiac medication regimen includes lisinopril.  Keylor does not check his blood pressure other than at his clinic appointments.  He is tolerating the medication well without side effects.  Compliance with treatment has been fair; he Eats fairly healthy. Not exercising.Marland Kitchen   Bp has dropped last week to 80-100/40-50s. Pulse 88-120. Stopped lisinopril. BP today 116/68.   Started on phentermine at his last visit. Stopped phentermine and lisinopril. Bp improved, but heart rate still high.   Social History   Socioeconomic History  . Marital status: Single    Spouse name: Not on file  . Number of children: 2   . Years of education: Not on file  . Highest education level: Not on file  Occupational History    Comment: [employer]  Tobacco Use  . Smoking status: Former Smoker    Types: Cigars    Quit date: 2013    Years since quitting: 8.1  . Smokeless tobacco: Never Used  Substance and Sexual Activity  . Alcohol use: Not Currently  . Drug use: Never  . Sexual activity: Not on file  Other Topics Concern  . Not on file  Social History Narrative  . Not on file   Social Determinants of Health   Financial Resource Strain:   . Difficulty of Paying Living Expenses:   Food Insecurity:   . Worried About Charity fundraiser in the Last Year:   . Arboriculturist in the Last Year:   Transportation Needs:   . Film/video editor (Medical):   Marland Kitchen Lack of Transportation (Non-Medical):   Physical Activity:   . Days of Exercise per Week:   . Minutes of Exercise per Session:   Stress:   . Feeling of Stress :   Social Connections:   . Frequency of Communication with Friends and Family:   . Frequency of Social Gatherings with Friends and Family:   . Attends Religious Services:   . Active Member of Clubs or Organizations:   . Attends Archivist Meetings:   Marland Kitchen Marital Status:    Past Medical History:  Diagnosis Date  . Back pain   . Chronic pain syndrome   .  Diabetes (HCC)   . History of stomach ulcers   . Hyperlipidemia   . Hyperparathyroidism (HCC)   . Hypertension   . Primary insomnia   . Pulmonary embolism (HCC)   . Sleep apnea   . Thyroid disease    Family History  Problem Relation Age of Onset  . Stroke Mother   . Diabetes Mother   . Cushing syndrome Father   . Thyroid disease Sister     Review of Systems  Constitutional: Positive for fatigue. Negative for chills and fever.  HENT: Negative for congestion, ear pain and sore throat.   Respiratory: Negative for cough and shortness of breath.   Cardiovascular: Negative for chest pain.    Gastrointestinal: Positive for constipation, nausea and vomiting. Negative for abdominal pain and diarrhea.       Dry heaves. Having BMs every 2 days. Taking miralax and magnesium citrate (x 2.)  Hard stools.  Endocrine: Positive for polydipsia, polyphagia and polyuria.  Genitourinary: Positive for difficulty urinating. Negative for dysuria and frequency.       Started sal palmetto. Was unable to get urology appointment without a referral.. Urinating better.   Musculoskeletal: Negative for arthralgias and myalgias.  Neurological: Negative for dizziness and headaches.  Psychiatric/Behavioral: Negative for dysphoric mood.       No dysphoria   Diabetic Foot Exam - Simple   Simple Foot Form Visual Inspection See comments: Yes Sensation Testing See comments: Yes Pulse Check Posterior Tibialis and Dorsalis pulse intact bilaterally: Yes Comments Calluses. Thickened toenails. Mild decreased sensation.     Objective:  BP 116/68   Pulse (!) 108   Temp (!) 96.3 F (35.7 C)   Resp 18   Ht 5\' 9"  (1.753 m)   Wt 293 lb 6.4 oz (133.1 kg)   BMI 43.33 kg/m   BP/Weight 01/31/2020 10/16/2018 09/18/2018  Systolic BP 116 126 136  Diastolic BP 68 64 81  Wt. (Lbs) 293.4 297.12 295  BMI 43.33 43.88 43.56    Physical Exam Vitals reviewed.  Constitutional:      Appearance: Normal appearance. He is obese.  Neck:     Vascular: No carotid bruit.  Cardiovascular:     Rate and Rhythm: Normal rate and regular rhythm.     Pulses: Normal pulses.     Heart sounds: Normal heart sounds.  Pulmonary:     Effort: Pulmonary effort is normal.     Breath sounds: Normal breath sounds. No wheezing, rhonchi or rales.  Abdominal:     General: Bowel sounds are normal.     Palpations: Abdomen is soft.     Tenderness: There is no abdominal tenderness.  Neurological:     Mental Status: He is alert.  Psychiatric:        Mood and Affect: Mood normal.        Behavior: Behavior normal.    Diabetic Foot  Exam - Simple   Simple Foot Form Visual Inspection See comments: Yes Sensation Testing See comments: Yes Pulse Check Posterior Tibialis and Dorsalis pulse intact bilaterally: Yes Comments Calluses. Thickened toenails. Mild decreased sensation.     Lab Results  Component Value Date   WBC 9.1 09/14/2018   HGB 14.9 09/14/2018   HCT 43.7 09/14/2018   PLT 292 09/14/2018   GLUCOSE 128 (H) 09/14/2018   NA 142 09/14/2018   K 4.6 09/14/2018   CL 106 09/14/2018   CREATININE 0.92 09/14/2018   BUN 16 09/14/2018   CO2 22 09/14/2018  Assessment & Plan:    Essential hypertension Blood pressure has dropped. Continue to hold lisinopril.  Continue to work on eating a healthy diet and exercise.  Labs drawn today.  EKG shows sinus tachycardia.  Discontinue phentermine.  Type 2 diabetes mellitus without complication, without long-term current use of insulin (HCC) Diabetes is out of control.  Sugars are very high at times but also then will drop suddenly.  I did change Kombiglyze xr to [employer] due to insurance.  Hopefully the increase in his sugars was due to a lapse in treatment between the change in Kombiglyze to Janumet.  Continue to work on eating a healthy diet and exercise.  Labs drawn today.   Dyslipidemia Well controlled.  No changes to medicines.  Continue to work on eating a healthy diet and exercise.  Labs drawn today.   Obesities, morbid (HCC) Poorly controlled.  Stop phentermine due to sinus tachycardia.  Patient needs to work on eating healthier and exercising.  This is limited due to his knee pain however he needs to continue to try.  Chronic idiopathic constipation Failed on MiraLAX and magnesium citrate.  Start patient on Linzess 145 mg once daily.  Give samples.  Patient to call back if works and will send a prescription.  Sinus tachycardia Likely due to phentermine.  Discontinue.   Follow-up: Return in about 4 weeks (around 02/28/2020).  AVS was given to  patient prior to departure.  Blane Ohara [facility] [phone]

## 2020-01-30 NOTE — Assessment & Plan Note (Addendum)
Diabetes is out of control.  Sugars are very high at times but also then will drop suddenly.  I did change Kombiglyze xr to [employer] due to insurance.  Hopefully the increase in his sugars was due to a lapse in treatment between the change in Kombiglyze to Janumet.  Continue to work on eating a healthy diet and exercise.  Labs drawn today.

## 2020-01-31 ENCOUNTER — Ambulatory Visit (INDEPENDENT_AMBULATORY_CARE_PROVIDER_SITE_OTHER): Payer: PRIVATE HEALTH INSURANCE | Admitting: Family Medicine

## 2020-01-31 ENCOUNTER — Encounter: Payer: Self-pay | Admitting: Family Medicine

## 2020-01-31 ENCOUNTER — Other Ambulatory Visit: Payer: Self-pay

## 2020-01-31 ENCOUNTER — Ambulatory Visit: Payer: BLUE CROSS/BLUE SHIELD | Admitting: Family Medicine

## 2020-01-31 VITALS — BP 116/68 | HR 108 | Temp 96.3°F | Resp 18 | Ht 69.0 in | Wt 293.4 lb

## 2020-01-31 DIAGNOSIS — I10 Essential (primary) hypertension: Secondary | ICD-10-CM

## 2020-01-31 DIAGNOSIS — K5904 Chronic idiopathic constipation: Secondary | ICD-10-CM

## 2020-01-31 DIAGNOSIS — Z6841 Body Mass Index (BMI) 40.0 and over, adult: Secondary | ICD-10-CM

## 2020-01-31 DIAGNOSIS — R000 Tachycardia, unspecified: Secondary | ICD-10-CM

## 2020-01-31 DIAGNOSIS — E1142 Type 2 diabetes mellitus with diabetic polyneuropathy: Secondary | ICD-10-CM

## 2020-01-31 DIAGNOSIS — E785 Hyperlipidemia, unspecified: Secondary | ICD-10-CM | POA: Diagnosis not present

## 2020-01-31 HISTORY — DX: Tachycardia, unspecified: R00.0

## 2020-01-31 NOTE — Assessment & Plan Note (Signed)
Failed on MiraLAX and magnesium citrate.  Start patient on Linzess 145 mg once daily.  Give samples.  Patient to call back if works and will send a prescription.

## 2020-01-31 NOTE — Patient Instructions (Addendum)
Essential hypertension Blood pressure has dropped. Continue to hold lisinopril.  Continue to work on eating a healthy diet and exercise.  Labs drawn today.  EKG shows sinus tachycardia.  Discontinue phentermine.  Type 2 diabetes mellitus without complication, without long-term current use of insulin (HCC) Diabetes is out of control.  Sugars are very high at times but also then will drop suddenly.  I did change Kombiglyze xr to [employer] due to insurance.  Hopefully the increase in his sugars was due to a lapse in treatment between the change in [place] to [place].  Continue to work on eating a healthy diet and exercise.  Labs drawn today.   Dyslipidemia Well controlled.  No changes to medicines.  Continue to work on eating a healthy diet and exercise.  Labs drawn today.   Obesities, morbid ([place]) Poorly controlled.  Stop phentermine due to sinus tachycardia.  Patient needs to work on eating healthier and exercising.  This is limited due to his knee pain however he needs to continue to try.  Chronic idiopathic constipation Failed on MiraLAX and magnesium citrate.  Start patient on Linzess 145 mg once daily.  Give samples.  Patient to call back if works and will send a prescription.  Sinus tachycardia Likely due to phentermine.  Discontinue.  Diabetes Mellitus and Nutrition, Adult When you have diabetes (diabetes mellitus), it is very important to have healthy eating habits because your blood sugar (glucose) levels are greatly affected by what you eat and drink. Eating healthy foods in the appropriate amounts, at about the same times every day, can help you:  Control your blood glucose.  Lower your risk of heart disease.  Improve your blood pressure.  Reach or maintain a healthy weight. Every person with diabetes is different, and each person has different needs for a meal plan. Your health care provider may recommend that you work with a diet and nutrition specialist (dietitian)  to make a meal plan that is best for you. Your meal plan may vary depending on factors such as:  The calories you need.  The medicines you take.  Your weight.  Your blood glucose, blood pressure, and cholesterol levels.  Your activity level.  Other health conditions you have, such as heart or kidney disease. How do carbohydrates affect me? Carbohydrates, also called carbs, affect your blood glucose level more than any other type of food. Eating carbs naturally raises the amount of glucose in your blood. Carb counting is a method for keeping track of how many carbs you eat. Counting carbs is important to keep your blood glucose at a healthy level, especially if you use insulin or take certain oral diabetes medicines. It is important to know how many carbs you can safely have in each meal. This is different for every person. Your dietitian can help you calculate how many carbs you should have at each meal and for each snack. Foods that contain carbs include:  Bread, cereal, rice, pasta, and crackers.  Potatoes and corn.  Peas, beans, and lentils.  Milk and yogurt.  Fruit and juice.  Desserts, such as cakes, cookies, ice cream, and candy. How does alcohol affect me? Alcohol can cause a sudden decrease in blood glucose (hypoglycemia), especially if you use insulin or take certain oral diabetes medicines. Hypoglycemia can be a life-threatening condition. Symptoms of hypoglycemia (sleepiness, dizziness, and confusion) are similar to symptoms of having too much alcohol. If your health care provider says that alcohol is safe for you, follow these  guidelines:  Limit alcohol intake to no more than 1 drink per day for nonpregnant women and 2 drinks per day for men. One drink equals 12 oz of beer, 5 oz of wine, or 1 oz of hard liquor.  Do not drink on an empty stomach.  Keep yourself hydrated with water, diet soda, or unsweetened iced tea.  Keep in mind that regular soda, juice, and other  mixers may contain a lot of sugar and must be counted as carbs. What are tips for following this plan?  Reading food labels  Start by checking the serving size on the "Nutrition Facts" label of packaged foods and drinks. The amount of calories, carbs, fats, and other nutrients listed on the label is based on one serving of the item. Many items contain more than one serving per package.  Check the total grams (g) of carbs in one serving. You can calculate the number of servings of carbs in one serving by dividing the total carbs by 15. For example, if a food has 30 g of total carbs, it would be equal to 2 servings of carbs.  Check the number of grams (g) of saturated and trans fats in one serving. Choose foods that have low or no amount of these fats.  Check the number of milligrams (mg) of salt (sodium) in one serving. Most people should limit total sodium intake to less than 2,300 mg per day.  Always check the nutrition information of foods labeled as "low-fat" or "nonfat". These foods may be higher in added sugar or refined carbs and should be avoided.  Talk to your dietitian to identify your daily goals for nutrients listed on the label. Shopping  Avoid buying canned, premade, or processed foods. These foods tend to be high in fat, sodium, and added sugar.  Shop around the outside edge of the grocery store. This includes fresh fruits and vegetables, bulk grains, fresh meats, and fresh dairy. Cooking  Use low-heat cooking methods, such as baking, instead of high-heat cooking methods like deep frying.  Cook using healthy oils, such as olive, canola, or sunflower oil.  Avoid cooking with butter, cream, or high-fat meats. Meal planning  Eat meals and snacks regularly, preferably at the same times every day. Avoid going long periods of time without eating.  Eat foods high in fiber, such as fresh fruits, vegetables, beans, and whole grains. Talk to your dietitian about how many servings  of carbs you can eat at each meal.  Eat 4-6 ounces (oz) of lean protein each day, such as lean meat, chicken, fish, eggs, or tofu. One oz of lean protein is equal to: ? 1 oz of meat, chicken, or fish. ? 1 egg. ?  cup of tofu.  Eat some foods each day that contain healthy fats, such as avocado, nuts, seeds, and fish. Lifestyle  Check your blood glucose regularly.  Exercise regularly as told by your health care provider. This may include: ? 150 minutes of moderate-intensity or vigorous-intensity exercise each week. This could be brisk walking, biking, or water aerobics. ? Stretching and doing strength exercises, such as yoga or weightlifting, at least 2 times a week.  Take medicines as told by your health care provider.  Do not use any products that contain nicotine or tobacco, such as cigarettes and e-cigarettes. If you need help quitting, ask your health care provider.  Work with a Veterinary surgeon or diabetes educator to identify strategies to manage stress and any emotional and social challenges. Questions to  ask a health care provider  Do I need to meet with a diabetes educator?  Do I need to meet with a dietitian?  What number can I call if I have questions?  When are the best times to check my blood glucose? Where to find more information:  American Diabetes Association: diabetes.org  Academy of Nutrition and Dietetics: www.eatright.[employer] of Diabetes and Digestive and Kidney Diseases (NIH): CarFlippers.tn Summary  A healthy meal plan will help you control your blood glucose and maintain a healthy lifestyle.  Working with a diet and nutrition specialist (dietitian) can help you make a meal plan that is best for you.  Keep in mind that carbohydrates (carbs) and alcohol have immediate effects on your blood glucose levels. It is important to count carbs and to use alcohol carefully. This information is not intended to replace advice given to you by your  health care provider. Make sure you discuss any questions you have with your health care provider. Document Revised: 10/20/2017 Document Reviewed: 12/12/2016 Elsevier Patient Education  2020 Elsevier Inc.  DASH Eating Plan DASH stands for "Dietary Approaches to Stop Hypertension." The DASH eating plan is a healthy eating plan that has been shown to reduce high blood pressure (hypertension). It may also reduce your risk for type 2 diabetes, heart disease, and stroke. The DASH eating plan may also help with weight loss. What are tips for following this plan?  General guidelines  Avoid eating more than 2,300 mg (milligrams) of salt (sodium) a day. If you have hypertension, you may need to reduce your sodium intake to 1,500 mg a day.  Limit alcohol intake to no more than 1 drink a day for nonpregnant women and 2 drinks a day for men. One drink equals 12 oz of beer, 5 oz of wine, or 1 oz of hard liquor.  Work with your health care provider to maintain a healthy body weight or to lose weight. Ask what an ideal weight is for you.  Get at least 30 minutes of exercise that causes your heart to beat faster (aerobic exercise) most days of the week. Activities may include walking, swimming, or biking.  Work with your health care provider or diet and nutrition specialist (dietitian) to adjust your eating plan to your individual calorie needs. Reading food labels   Check food labels for the amount of sodium per serving. Choose foods with less than 5 percent of the Daily Value of sodium. Generally, foods with less than 300 mg of sodium per serving fit into this eating plan.  To find whole grains, look for the word "whole" as the first word in the ingredient list. Shopping  Buy products labeled as "low-sodium" or "no salt added."  Buy fresh foods. Avoid canned foods and premade or frozen meals. Cooking  Avoid adding salt when cooking. Use salt-free seasonings or herbs instead of table salt or sea  salt. Check with your health care provider or pharmacist before using salt substitutes.  Do not fry foods. Cook foods using healthy methods such as baking, boiling, grilling, and broiling instead.  Cook with heart-healthy oils, such as olive, canola, soybean, or sunflower oil. Meal planning  Eat a balanced diet that includes: ? 5 or more servings of fruits and vegetables each day. At each meal, try to fill half of your plate with fruits and vegetables. ? Up to 6-8 servings of whole grains each day. ? Less than 6 oz of lean meat, poultry, or fish each day.  A 3-oz serving of meat is about the same size as a deck of cards. One egg equals 1 oz. ? 2 servings of low-fat dairy each day. ? A serving of nuts, seeds, or beans 5 times each week. ? Heart-healthy fats. Healthy fats called Omega-3 fatty acids are found in foods such as flaxseeds and coldwater fish, like sardines, salmon, and mackerel.  Limit how much you eat of the following: ? Canned or prepackaged foods. ? Food that is high in trans fat, such as fried foods. ? Food that is high in saturated fat, such as fatty meat. ? Sweets, desserts, sugary drinks, and other foods with added sugar. ? Full-fat dairy products.  Do not salt foods before eating.  Try to eat at least 2 vegetarian meals each week.  Eat more home-cooked food and less restaurant, buffet, and fast food.  When eating at a restaurant, ask that your food be prepared with less salt or no salt, if possible. What foods are recommended? The items listed may not be a complete list. Talk with your dietitian about what dietary choices are best for you. Grains Whole-grain or whole-wheat bread. Whole-grain or whole-wheat pasta. Brown rice. Orpah Cobbatmeal. Quinoa. Bulgur. Whole-grain and low-sodium cereals. Pita bread. Low-fat, low-sodium crackers. Whole-wheat flour tortillas. Vegetables Fresh or frozen vegetables (raw, steamed, roasted, or grilled). Low-sodium or reduced-sodium tomato  and vegetable juice. Low-sodium or reduced-sodium tomato sauce and tomato paste. Low-sodium or reduced-sodium canned vegetables. Fruits All fresh, dried, or frozen fruit. Canned fruit in natural juice (without added sugar). Meat and other protein foods Skinless chicken or Malawiturkey. Ground chicken or [place]urkey. Pork with fat trimmed off. Fish and seafood. Egg whites. Dried beans, peas, or lentils. Unsalted nuts, nut butters, and seeds. Unsalted canned beans. Lean cuts of beef with fat trimmed off. Low-sodium, lean deli meat. Dairy Low-fat (1%) or fat-free (skim) milk. Fat-free, low-fat, or reduced-fat cheeses. Nonfat, low-sodium ricotta or cottage cheese. Low-fat or nonfat yogurt. Low-fat, low-sodium cheese. Fats and oils Soft margarine without trans fats. Vegetable oil. Low-fat, reduced-fat, or light mayonnaise and salad dressings (reduced-sodium). Canola, safflower, olive, soybean, and sunflower oils. Avocado. Seasoning and other foods Herbs. Spices. Seasoning mixes without salt. Unsalted popcorn and pretzels. Fat-free sweets. What foods are not recommended? The items listed may not be a complete list. Talk with your dietitian about what dietary choices are best for you. Grains Baked goods made with fat, such as croissants, muffins, or some breads. Dry pasta or rice meal packs. Vegetables Creamed or fried vegetables. Vegetables in a cheese sauce. Regular canned vegetables (not low-sodium or reduced-sodium). Regular canned tomato sauce and paste (not low-sodium or reduced-sodium). Regular tomato and vegetable juice (not low-sodium or reduced-sodium). Rosita FirePickles. Olives. Fruits Canned fruit in a light or heavy syrup. Fried fruit. Fruit in cream or butter sauce. Meat and other protein foods Fatty cuts of meat. Ribs. Fried meat. Tomasa BlaseBacon. Sausage. Bologna and other processed lunch meats. Salami. Fatback. Hotdogs. Bratwurst. Salted nuts and seeds. Canned beans with added salt. Canned or smoked fish. Whole eggs  or egg yolks. Chicken or [place]urkey with skin. Dairy Whole or 2% milk, cream, and half-and-half. Whole or full-fat cream cheese. Whole-fat or sweetened yogurt. Full-fat cheese. Nondairy creamers. Whipped toppings. Processed cheese and cheese spreads. Fats and oils Butter. Stick margarine. Lard. Shortening. Ghee. Bacon fat. Tropical oils, such as coconut, palm kernel, or palm oil. Seasoning and other foods Salted popcorn and pretzels. Onion salt, garlic salt, seasoned salt, table salt, and sea salt. Worcestershire sauce. Tartar  sauce. Barbecue sauce. Teriyaki sauce. Soy sauce, including reduced-sodium. Steak sauce. Canned and packaged gravies. Fish sauce. Oyster sauce. Cocktail sauce. Horseradish that you find on the shelf. Ketchup. Mustard. Meat flavorings and tenderizers. Bouillon cubes. Hot sauce and Tabasco sauce. Premade or packaged marinades. Premade or packaged taco seasonings. Relishes. Regular salad dressings. Where to find more information:  National Heart, Lung, and Blood Institute: PopSteam.is  American Heart Association: www.heart.org Summary  The DASH eating plan is a healthy eating plan that has been shown to reduce high blood pressure (hypertension). It may also reduce your risk for type 2 diabetes, heart disease, and stroke.  With the DASH eating plan, you should limit salt (sodium) intake to 2,300 mg a day. If you have hypertension, you may need to reduce your sodium intake to 1,500 mg a day.  When on the DASH eating plan, aim to eat more fresh fruits and vegetables, whole grains, lean proteins, low-fat dairy, and heart-healthy fats.  Work with your health care provider or diet and nutrition specialist (dietitian) to adjust your eating plan to your individual calorie needs. This information is not intended to replace advice given to you by your health care provider. Make sure you discuss any questions you have with your health care provider. Document Revised: 10/20/2017  Document Reviewed: 10/31/2016 Elsevier Patient Education  2020 [employer].

## 2020-01-31 NOTE — Assessment & Plan Note (Signed)
Likely due to phentermine.  Discontinue.

## 2020-02-01 LAB — COMPREHENSIVE METABOLIC PANEL
ALT: 26 IU/L (ref 0–44)
AST: 18 IU/L (ref 0–40)
Albumin/Globulin Ratio: 1.7 (ref 1.2–2.2)
Albumin: 4.2 g/dL (ref 3.8–4.9)
Alkaline Phosphatase: 43 IU/L (ref 39–117)
BUN/Creatinine Ratio: 19 (ref 9–20)
BUN: 23 mg/dL (ref 6–24)
Bilirubin Total: <0.2 mg/dL (ref 0.0–1.2)
CO2: 20 mmol/L (ref 20–29)
Calcium: 10.4 mg/dL — ABNORMAL HIGH (ref 8.7–10.2)
Chloride: 102 mmol/L (ref 96–106)
Creatinine, Ser: 1.19 mg/dL (ref 0.76–1.27)
GFR calc Af Amer: 78 mL/min/{1.73_m2} (ref >59)
GFR calc non Af Amer: 67 mL/min/{1.73_m2} (ref >59)
Globulin, Total: 2.5 g/dL (ref 1.5–4.5)
Glucose: 188 mg/dL — ABNORMAL HIGH (ref 65–99)
Potassium: 5.2 mmol/L (ref 3.5–5.2)
Sodium: 138 mmol/L (ref 134–144)
Total Protein: 6.7 g/dL (ref 6.0–8.5)

## 2020-02-01 LAB — CBC WITH DIFFERENTIAL/PLATELET
Basophils Absolute: 0 10*3/uL (ref 0.0–0.2)
Basos: 0 %
EOS (ABSOLUTE): 0.1 10*3/uL (ref 0.0–0.4)
Eos: 1 %
Hematocrit: 43.9 % (ref 37.5–51.0)
Hemoglobin: 14.3 g/dL (ref 13.0–17.7)
Immature Grans (Abs): 0.1 10*3/uL (ref 0.0–0.1)
Immature Granulocytes: 1 %
Lymphocytes Absolute: 1.3 10*3/uL (ref 0.7–3.1)
Lymphs: 13 %
MCH: 29.3 pg (ref 26.6–33.0)
MCHC: 32.6 g/dL (ref 31.5–35.7)
MCV: 90 fL (ref 79–97)
Monocytes Absolute: 0.7 10*3/uL (ref 0.1–0.9)
Monocytes: 7 %
Neutrophils Absolute: 7.6 10*3/uL — ABNORMAL HIGH (ref 1.4–7.0)
Neutrophils: 78 %
Platelets: 289 10*3/uL (ref 150–450)
RBC: 4.88 x10E6/uL (ref 4.14–5.80)
RDW: 14 % (ref 11.6–15.4)
WBC: 9.7 10*3/uL (ref 3.4–10.8)

## 2020-02-01 LAB — LIPID PANEL
Chol/HDL Ratio: 2.8 ratio (ref 0.0–5.0)
Cholesterol, Total: 158 mg/dL (ref 100–199)
HDL: 56 mg/dL (ref >39)
LDL Chol Calc (NIH): 83 mg/dL (ref 0–99)
Triglycerides: 105 mg/dL (ref 0–149)
VLDL Cholesterol Cal: 19 mg/dL (ref 5–40)

## 2020-02-01 LAB — TSH: TSH: 3.12 u[IU]/mL (ref 0.450–4.500)

## 2020-02-01 LAB — HEMOGLOBIN A1C
Est. average glucose Bld gHb Est-mCnc: 180 mg/dL
Hgb A1c MFr Bld: 7.9 % — ABNORMAL HIGH (ref 4.8–5.6)

## 2020-02-01 LAB — CARDIOVASCULAR RISK ASSESSMENT

## 2020-02-02 ENCOUNTER — Other Ambulatory Visit: Payer: Self-pay | Admitting: Family Medicine

## 2020-02-12 ENCOUNTER — Other Ambulatory Visit: Payer: Self-pay | Admitting: Family Medicine

## 2020-02-14 ENCOUNTER — Other Ambulatory Visit: Payer: Self-pay

## 2020-02-14 MED ORDER — PANTOPRAZOLE SODIUM 40 MG PO TBEC: 40.0000 mg | DELAYED_RELEASE_TABLET | Freq: Every day | ORAL | 0 refills | Status: DC

## 2020-02-21 ENCOUNTER — Ambulatory Visit: Payer: PRIVATE HEALTH INSURANCE | Attending: Internal Medicine

## 2020-02-21 DIAGNOSIS — Z23 Encounter for immunization: Secondary | ICD-10-CM

## 2020-02-21 NOTE — Progress Notes (Signed)
   Covid-19 Vaccination Clinic  Name:  Russell Klein    MRN: [id number] DOB: [birth year]  02/21/2020  Russell Klein was observed post Covid-19 immunization for 15 minutes without incident. He was provided with Vaccine Information Sheet and instruction to access the V-Safe system.   Russell Klein was instructed to call 911 with any severe reactions post vaccine: Marland Kitchen Difficulty breathing  . Swelling of face and throat  . A fast heartbeat  . A bad rash all over body  . Dizziness and weakness   Immunizations Administered    Name Date Dose VIS Date Route   Pfizer COVID-19 Vaccine 02/21/2020  9:41 AM 0.3 mL 11/01/2019 Intramuscular   Manufacturer: [employer], [employer]   Lot: [id number]   NDC: [address]-8

## 2020-03-02 NOTE — Progress Notes (Signed)
Established Patient Office Visit  Subjective:  Patient ID: Russell Klein, male    DOB: Apr 01, 1961  Age: 59 y.o. MRN: [id number]  CC:  Chief Complaint  Patient presents with  . Diabetes  . Hyperlipidemia  . Hypothyroidism  . Hypertension    HPI Russell Klein presents for diabetes. Checking sugar 1-2 times a day. 130-160. Taking jardiance 25 mg once daily, janumet 50/1000 mg one twice a day, actos 15 mg once daily, glipizide 5 mg once daily. He checks his feet daily. Patient is overdue for an eye exam.  Hypertension has been good off lisinopril. He is still taking  metoprolol. Bp had dropped low and I had held his lisinopril.   He is trying to eat healthy.His job is very active, but he does not exercise outside of work.   He is complaining of sinus pain and headache. He has tried mucinex dm and tylenol sinus. These may have helped, but symptoms come right back.   Past Medical History:  Diagnosis Date  . Back pain   . Chronic pain syndrome   . Diabetes (HCC)   . History of stomach ulcers   . Hyperlipidemia   . Hyperparathyroidism (HCC)   . Hypertension   . Primary insomnia   . Pulmonary embolism (HCC)   . Sleep apnea   . Thyroid disease       Family History  Problem Relation Age of Onset  . Stroke Mother   . Diabetes Mother   . Cushing syndrome Father   . Thyroid disease Sister     Social History   Socioeconomic History  . Marital status: Single    Spouse name: Not on file  . Number of children: 2  . Years of education: Not on file  . Highest education level: Not on file  Occupational History    Comment: [employer]  Tobacco Use  . Smoking status: Former Smoker    Types: Cigars    Quit date: 2013    Years since quitting: 8.2  . Smokeless tobacco: Never Used  Substance and Sexual Activity  . Alcohol use: Not Currently  . Drug use: Never  . Sexual activity: Not on file  Other Topics Concern  . Not on file  Social History Narrative  . Not  on file   Social Determinants of Health   Financial Resource Strain:   . Difficulty of Paying Living Expenses:   Food Insecurity:   . Worried About Programme researcher, broadcasting/film/video in the Last Year:   . Barista in the Last Year:   Transportation Needs:   . Freight forwarder (Medical):   Marland Kitchen Lack of Transportation (Non-Medical):   Physical Activity:   . Days of Exercise per Week:   . Minutes of Exercise per Session:   Stress:   . Feeling of Stress :   Social Connections:   . Frequency of Communication with Friends and Family:   . Frequency of Social Gatherings with Friends and Family:   . Attends Religious Services:   . Active Member of Clubs or Organizations:   . Attends Banker Meetings:   Marland Kitchen Marital Status:   Intimate Partner Violence:   . Fear of Current or Ex-Partner:   . Emotionally Abused:   Marland Kitchen Physically Abused:   . Sexually Abused:     Outpatient Medications Prior to Visit  Medication Sig Dispense Refill  . diclofenac Sodium (VOLTAREN) 1 % GEL Apply 4 grams to R knee 2  to 3 times a day prn pain    . fluticasone (FLONASE) 50 MCG/ACT nasal spray Place into the nose.    . loratadine (CLARITIN) 10 MG tablet Take by mouth.    . traMADol (ULTRAM) 50 MG tablet     . aspirin EC 81 MG tablet Take 81 mg by mouth daily.    Marland Kitchen atorvastatin (LIPITOR) 10 MG tablet Take 1 tablet by mouth once daily 90 tablet 0  . Cholecalciferol (VITAMIN D3) 5000 units TABS Take 1 tablet by mouth daily.    Marland Kitchen FLUoxetine (PROZAC) 20 MG tablet Take 20 mg by mouth daily.    Marland Kitchen glipiZIDE (GLUCOTROL) 5 MG tablet TAKE 1 TABLET BY MOUTH ONCE DAILY IF FASTING BLOOD SUGAR IS OVER 150 30 tablet 0  . JANUMET 50-1000 MG tablet Take 1 tablet by mouth 2 (two) times daily.    Marland Kitchen JARDIANCE 25 MG TABS tablet Take 1 tablet by mouth daily.  0  . meloxicam (MOBIC) 15 MG tablet Take 15 mg by mouth daily.    . metoprolol succinate (TOPROL-XL) 25 MG 24 hr tablet Take 25 mg by mouth daily.  0  . nitroGLYCERIN  (NITROSTAT) 0.4 MG SL tablet Place 0.4 mg under the tongue every 5 (five) minutes as needed for chest pain.   1  . pantoprazole (PROTONIX) 40 MG tablet Take 1 tablet (40 mg total) by mouth daily. 90 tablet 0  . pioglitazone (ACTOS) 15 MG tablet Take 15 mg by mouth daily.  0  . traZODone (DESYREL) 50 MG tablet Take 50 mg by mouth at bedtime.    Marland Kitchen lisinopril (PRINIVIL,ZESTRIL) 20 MG tablet Take 20 mg by mouth daily. Taking only if bp elevated     No facility-administered medications prior to visit.    Allergies  Allergen Reactions  . Invokamet [Canagliflozin-Metformin Hcl] Nausea Only    ROS Review of Systems  Constitutional: Negative for chills, diaphoresis, fatigue and fever.  HENT: Positive for congestion, sinus pressure and sinus pain. Negative for ear pain, sneezing and sore throat.   Respiratory: Negative for cough and shortness of breath.   Cardiovascular: Negative for chest pain and leg swelling.  Gastrointestinal: Negative for abdominal pain, constipation, diarrhea, nausea and vomiting.  Endocrine: Positive for polyphagia and polyuria. Negative for polydipsia.  Genitourinary: Negative for dysuria and urgency.  Musculoskeletal: Negative for arthralgias and myalgias.  Neurological: Negative for dizziness and headaches.  Psychiatric/Behavioral: Negative for dysphoric mood. The patient is not nervous/anxious.        No anhedonia.      Objective:    Physical Exam  Constitutional: He appears well-developed and well-nourished.  HENT:  Right Ear: External ear normal.  Left Ear: External ear normal.  Nose: Mucosal edema and rhinorrhea present. Right sinus exhibits maxillary sinus tenderness. Right sinus exhibits no frontal sinus tenderness. Left sinus exhibits maxillary sinus tenderness. Left sinus exhibits no frontal sinus tenderness.  Mouth/Throat: Oropharynx is clear and moist. No oropharyngeal exudate.  Cardiovascular: Normal rate, regular rhythm and normal heart sounds.    Pulmonary/Chest: Effort normal and breath sounds normal.  Neurological: He is alert.  Psychiatric: He has a normal mood and affect. His behavior is normal.    BP 120/64   Pulse 84   Temp (!) 97.3 F (36.3 C)   Resp 18   Ht 5\' 9"  (1.753 m)   Wt 299 lb (135.6 kg)   BMI 44.15 kg/m  Wt Readings from Last 3 Encounters:  03/03/20 299 lb (135.6 kg)  01/31/20  293 lb 6.4 oz (133.1 kg)  10/16/18 297 lb 1.9 oz (134.8 kg)     Health Maintenance Due  Topic Date Due  . Hepatitis C Screening  Never done  . PNEUMOCOCCAL POLYSACCHARIDE VACCINE AGE 46-64 HIGH RISK  Never done  . FOOT EXAM  Never done  . OPHTHALMOLOGY EXAM  Never done  . HIV Screening  Never done  . COVID-19 Vaccine (1) Never done  . TETANUS/TDAP  Never done  . COLONOSCOPY  Never done    There are no preventive care reminders to display for this patient.  Lab Results  Component Value Date   TSH 3.120 01/31/2020   Lab Results  Component Value Date   WBC 9.7 01/31/2020   HGB 14.3 01/31/2020   HCT 43.9 01/31/2020   MCV 90 01/31/2020   PLT 289 01/31/2020   Lab Results  Component Value Date   NA 138 01/31/2020   K 5.2 01/31/2020   CO2 20 01/31/2020   GLUCOSE 188 (H) 01/31/2020   BUN 23 01/31/2020   CREATININE 1.19 01/31/2020   BILITOT <0.2 01/31/2020   ALKPHOS 43 01/31/2020   AST 18 01/31/2020   ALT 26 01/31/2020   PROT 6.7 01/31/2020   ALBUMIN 4.2 01/31/2020   CALCIUM 10.4 (H) 01/31/2020   Lab Results  Component Value Date   CHOL 158 01/31/2020   Lab Results  Component Value Date   HDL 56 01/31/2020   Lab Results  Component Value Date   LDLCALC 83 01/31/2020   Lab Results  Component Value Date   TRIG 105 01/31/2020   Lab Results  Component Value Date   CHOLHDL 2.8 01/31/2020   Lab Results  Component Value Date   HGBA1C 7.9 (H) 01/31/2020      Assessment & Plan:  1. Essential hypertension Although well controlled, will restart lisinopril 20 mg 1/2 daily due to elevated protein in  urine.  2. Diabetic glomerulopathy (HCC) Restart lisinopril 20 mg 1/2 daily due to elevated protein in urine. Continue other diabetic medicines as sugars are improving.  3. Dyslipidemia The current medical regimen is effective;  continue present plan and medications.  4. Acute non-recurrent sinusitis of other sinus Start Augmentin. May continue mucinex dm or tylenol sinus, whichever works better.    Follow-up: Return in about 2 months (around 05/04/2020) for fasting.    Blane Ohara, MD

## 2020-03-03 ENCOUNTER — Ambulatory Visit (INDEPENDENT_AMBULATORY_CARE_PROVIDER_SITE_OTHER): Payer: PRIVATE HEALTH INSURANCE | Admitting: Family Medicine

## 2020-03-03 ENCOUNTER — Encounter: Payer: Self-pay | Admitting: Family Medicine

## 2020-03-03 ENCOUNTER — Other Ambulatory Visit: Payer: Self-pay

## 2020-03-03 VITALS — BP 120/64 | HR 84 | Temp 97.3°F | Resp 18 | Ht 69.0 in | Wt 299.0 lb

## 2020-03-03 DIAGNOSIS — J018 Other acute sinusitis: Secondary | ICD-10-CM | POA: Diagnosis not present

## 2020-03-03 DIAGNOSIS — E785 Hyperlipidemia, unspecified: Secondary | ICD-10-CM | POA: Diagnosis not present

## 2020-03-03 DIAGNOSIS — J019 Acute sinusitis, unspecified: Secondary | ICD-10-CM | POA: Insufficient documentation

## 2020-03-03 DIAGNOSIS — E1121 Type 2 diabetes mellitus with diabetic nephropathy: Secondary | ICD-10-CM

## 2020-03-03 DIAGNOSIS — I10 Essential (primary) hypertension: Secondary | ICD-10-CM

## 2020-03-03 MED ORDER — AMOXICILLIN-POT CLAVULANATE 875-125 MG PO TABS: 1.0000 | ORAL_TABLET | Freq: Two times a day (BID) | ORAL | 0 refills | Status: DC

## 2020-03-03 NOTE — Patient Instructions (Signed)
Hypertension currently well controlled off medicine, but will restart lisinopril 20 mg 1/2 pill daily to protect kidneys.  Continue diabetes medicines.  Continue to work on eating healthy and exercise.

## 2020-03-06 ENCOUNTER — Other Ambulatory Visit: Payer: Self-pay | Admitting: Family Medicine

## 2020-03-08 ENCOUNTER — Encounter: Payer: Self-pay | Admitting: Family Medicine

## 2020-03-16 ENCOUNTER — Ambulatory Visit: Payer: PRIVATE HEALTH INSURANCE | Attending: Internal Medicine

## 2020-03-16 DIAGNOSIS — Z23 Encounter for immunization: Secondary | ICD-10-CM

## 2020-03-16 NOTE — Progress Notes (Signed)
   Covid-19 Vaccination Clinic  Name:  Russell Klein    MRN: [id number] DOB: November 09, 1961  03/16/2020  [name] was observed post Covid-19 immunization for 15 minutes without incident. He was provided with Vaccine Information Sheet and instruction to access the V-Safe system.   [name] was instructed to call 911 with any severe reactions post vaccine: Marland Kitchen Difficulty breathing  . Swelling of face and throat  . A fast heartbeat  . A bad rash all over body  . Dizziness and weakness   Immunizations Administered    Name Date Dose VIS Date Route   Pfizer COVID-19 Vaccine 03/16/2020 12:24 PM 0.3 mL 01/15/2019 Intramuscular   Manufacturer: [employer], [employer]   Lot: [id number]   NDC: [address]-8

## 2020-03-20 ENCOUNTER — Other Ambulatory Visit: Payer: Self-pay | Admitting: Family Medicine

## 2020-03-27 ENCOUNTER — Other Ambulatory Visit: Payer: Self-pay | Admitting: Family Medicine

## 2020-04-13 ENCOUNTER — Other Ambulatory Visit: Payer: Self-pay | Admitting: Family Medicine

## 2020-04-14 ENCOUNTER — Other Ambulatory Visit: Payer: Self-pay

## 2020-04-14 MED ORDER — PANTOPRAZOLE SODIUM 40 MG PO TBEC: 40.0000 mg | DELAYED_RELEASE_TABLET | Freq: Every day | ORAL | 0 refills | Status: DC

## 2020-04-21 ENCOUNTER — Encounter: Payer: Self-pay | Admitting: Family Medicine

## 2020-04-21 ENCOUNTER — Ambulatory Visit (INDEPENDENT_AMBULATORY_CARE_PROVIDER_SITE_OTHER): Payer: PRIVATE HEALTH INSURANCE | Admitting: Family Medicine

## 2020-04-21 ENCOUNTER — Other Ambulatory Visit: Payer: Self-pay

## 2020-04-21 VITALS — BP 110/76 | HR 97 | Temp 97.4°F | Ht 69.0 in | Wt 299.0 lb

## 2020-04-21 DIAGNOSIS — M1711 Unilateral primary osteoarthritis, right knee: Secondary | ICD-10-CM

## 2020-04-21 DIAGNOSIS — Z6841 Body Mass Index (BMI) 40.0 and over, adult: Secondary | ICD-10-CM

## 2020-04-21 DIAGNOSIS — E1142 Type 2 diabetes mellitus with diabetic polyneuropathy: Secondary | ICD-10-CM | POA: Diagnosis not present

## 2020-04-21 DIAGNOSIS — M545 Low back pain, unspecified: Secondary | ICD-10-CM

## 2020-04-21 DIAGNOSIS — M1712 Unilateral primary osteoarthritis, left knee: Secondary | ICD-10-CM

## 2020-04-21 DIAGNOSIS — F5101 Primary insomnia: Secondary | ICD-10-CM

## 2020-04-21 MED ORDER — TRAZODONE HCL 50 MG PO TABS: 50.0000 mg | ORAL_TABLET | Freq: Every day | ORAL | 0 refills | Status: AC

## 2020-04-21 MED ORDER — METHOCARBAMOL 500 MG PO TABS: 500.0000 mg | ORAL_TABLET | Freq: Three times a day (TID) | ORAL | 0 refills | Status: DC

## 2020-04-21 NOTE — Patient Instructions (Addendum)
START OZEMPIC 0.25 MG ONCE WEEKLY ON Tuesday.  CONTINUE JANUMET UNTIL RETURNS ON 6/14. GET XRAYS OF BACK AND KNEES. START ON METHOCARBAMOL (ROBAXIN) ONE THREE TIMES A DAY AS NEEDED FOR MUSCLE PAIN. .  START ON TRAZODONE FOR SLEEP.

## 2020-04-21 NOTE — Progress Notes (Signed)
Acute Office Visit  Subjective:    Patient ID: Russell Klein, male    DOB: [birth year], 58 y.o.   MRN: [id number]  Chief Complaint  Patient presents with  . Back Pain    lower back pain. Helping around his brothers house with a deck which caused his back pain  . leg wound    Rt lower leg, x 2 weeks. Patient hit his leg on his trailer.    HPI Patient is in today for complaints of low back pain and knee pain.  He has been overworking again helping his brother put a deck on his house.  This was about 2 weeks ago.  He also scraped his right lower leg on a trailer.  Tylenol and over-the-counter anti-inflammatories have not been helping.  Previously he saw Dr. Thamas Jaegers for his knees but that was over 1 or 2 years ago.  Patient is also significantly concerned about his weight.  He tried phentermine however did not tolerate it caused palpitations.  He is diabetic and currently on Jardiance, Actos, janumet and glipizide. Sugars mid 100s.  Past Medical History:  Diagnosis Date  . Back pain   . Chronic pain syndrome   . Diabetes (HCC)   . History of stomach ulcers   . Hyperlipidemia   . Hyperparathyroidism (HCC)   . Hypertension   . Primary insomnia   . Pulmonary embolism (HCC)   . Sleep apnea   . Thyroid disease     Past Surgical History:  Procedure Laterality Date  . APPENDECTOMY    . Exploratory Laporatomy    . LEFT HEART CATH AND CORONARY ANGIOGRAPHY N/A 09/18/2018   Procedure: LEFT HEART CATH AND CORONARY ANGIOGRAPHY;  Surgeon: Marykay Lex, MD;  Location: [facility] INVASIVE CV LAB;  Service: Cardiovascular;  Laterality: N/A;  . PARATHYROIDECTOMY      Family History  Problem Relation Age of Onset  . Stroke Mother   . Diabetes Mother   . Cushing syndrome Father   . Thyroid disease Sister     Social History   Socioeconomic History  . Marital status: Single    Spouse name: Not on file  . Number of children: 2  . Years of education: Not on file  . Highest education level:  Not on file  Occupational History    Comment: [employer]  Tobacco Use  . Smoking status: Former Smoker    Types: Cigars    Quit date: 2013    Years since quitting: 8.4  . Smokeless tobacco: Never Used  Vaping Use  . Vaping Use: Never used  Substance and Sexual Activity  . Alcohol use: Not Currently  . Drug use: Never  . Sexual activity: Not on file  Other Topics Concern  . Not on file  Social History Narrative  . Not on file   Social Determinants of Health   Financial Resource Strain:   . Difficulty of Paying Living Expenses:   Food Insecurity:   . Worried About Programme researcher, broadcasting/film/video in the Last Year:   . Barista in the Last Year:   Transportation Needs:   . Freight forwarder (Medical):   Marland Kitchen Lack of Transportation (Non-Medical):   Physical Activity:   . Days of Exercise per Week:   . Minutes of Exercise per Session:   Stress:   . Feeling of Stress :   Social Connections:   . Frequency of Communication with Friends and Family:   . Frequency of Social  Gatherings with Friends and Family:   . Attends Religious Services:   . Active Member of Clubs or Organizations:   . Attends Banker Meetings:   Marland Kitchen Marital Status:   Intimate Partner Violence:   . Fear of Current or Ex-Partner:   . Emotionally Abused:   Marland Kitchen Physically Abused:   . Sexually Abused:     Outpatient Medications Prior to Visit  Medication Sig Dispense Refill  . aspirin EC 81 MG tablet Take 81 mg by mouth daily.    Marland Kitchen atorvastatin (LIPITOR) 10 MG tablet Take 1 tablet by mouth once daily 90 tablet 0  . Cholecalciferol (VITAMIN D3) 5000 units TABS Take 1 tablet by mouth daily.    . diclofenac Sodium (VOLTAREN) 1 % GEL Apply 4 grams to R knee 2 to 3 times a day prn pain    . FLUoxetine (PROZAC) 20 MG tablet Take 20 mg by mouth daily.    . fluticasone (FLONASE) 50 MCG/ACT nasal spray Place into the nose.    Marland Kitchen glipiZIDE (GLUCOTROL) 5 MG tablet TAKE 1 TABLET BY MOUTH ONCE DAILY  IF FASTING BLOOD SUGAR IS OVER 150 30 tablet 0  . JARDIANCE 25 MG TABS tablet TAKE 1 TABLET BY MOUTH ONCE DAILY IN THE MORNING 90 tablet 0  . lisinopril (ZESTRIL) 20 MG tablet Take 1 tablet by mouth once daily 90 tablet 0  . loratadine (CLARITIN) 10 MG tablet Take by mouth.    . meloxicam (MOBIC) 15 MG tablet Take 15 mg by mouth daily.    . metoprolol succinate (TOPROL-XL) 25 MG 24 hr tablet Take 25 mg by mouth daily.  0  . nitroGLYCERIN (NITROSTAT) 0.4 MG SL tablet Place 0.4 mg under the tongue every 5 (five) minutes as needed for chest pain.   1  . pantoprazole (PROTONIX) 40 MG tablet Take 1 tablet (40 mg total) by mouth daily. 90 tablet 0  . pioglitazone (ACTOS) 15 MG tablet Take 1 tablet by mouth once daily 90 tablet 0  . traMADol (ULTRAM) 50 MG tablet     . amoxicillin-clavulanate (AUGMENTIN) 875-125 MG tablet Take 1 tablet by mouth 2 (two) times daily. 20 tablet 0  . JANUMET 50-1000 MG tablet Take 1 tablet by mouth 2 (two) times daily.    . traZODone (DESYREL) 50 MG tablet Take 50 mg by mouth at bedtime.     No facility-administered medications prior to visit.    Allergies  Allergen Reactions  . Invokamet [Canagliflozin-Metformin Hcl] Nausea Only    Review of Systems  Constitutional: Positive for diaphoresis. Negative for chills and fever.  HENT: Negative for congestion, ear pain and sore throat.   Respiratory: Negative for cough and shortness of breath.   Cardiovascular: Negative for chest pain.  Musculoskeletal: Positive for arthralgias, back pain and myalgias.  Skin: Positive for wound (Rt lower leg).       Objective:    Physical Exam Vitals reviewed.  Constitutional:      Appearance: Normal appearance. He is obese.  Cardiovascular:     Rate and Rhythm: Normal rate and regular rhythm.  Pulmonary:     Effort: Pulmonary effort is normal.     Breath sounds: Normal breath sounds.  Abdominal:     General: Abdomen is flat. Bowel sounds are normal.     Palpations:  Abdomen is soft.  Musculoskeletal:        General: Tenderness (tender over lumbar back paraspinal muscles BL. BL knees tender with patellar apprehension. FROM of knees,  but discomfort iwth full flexion. Positive mcmurrays. ) present. No swelling or deformity.  Skin:    Comments: Small cut on his leg. Which is healing.   Neurological:     Mental Status: He is alert and oriented to person, place, and time.  Psychiatric:        Mood and Affect: Mood normal.        Behavior: Behavior normal.     BP 110/76   Pulse 97   Temp (!) 97.4 F (36.3 C)   Ht 5\' 9"  (1.753 m)   Wt 299 lb (135.6 kg)   SpO2 95%   BMI 44.15 kg/m  Wt Readings from Last 3 Encounters:  04/21/20 299 lb (135.6 kg)  03/03/20 299 lb (135.6 kg)  01/31/20 293 lb 6.4 oz (133.1 kg)    Health Maintenance Due  Topic Date Due  . Hepatitis C Screening  Never done  . PNEUMOCOCCAL POLYSACCHARIDE VACCINE AGE 51-64 HIGH RISK  Never done  . FOOT EXAM  Never done  . OPHTHALMOLOGY EXAM  Never done  . HIV Screening  Never done  . TETANUS/TDAP  Never done  . COLONOSCOPY  Never done    There are no preventive care reminders to display for this patient.   Lab Results  Component Value Date   TSH 3.120 01/31/2020   Lab Results  Component Value Date   WBC 9.7 01/31/2020   HGB 14.3 01/31/2020   HCT 43.9 01/31/2020   MCV 90 01/31/2020   PLT 289 01/31/2020   Lab Results  Component Value Date   NA 138 01/31/2020   K 5.2 01/31/2020   CO2 20 01/31/2020   GLUCOSE 188 (H) 01/31/2020   BUN 23 01/31/2020   CREATININE 1.19 01/31/2020   BILITOT <0.2 01/31/2020   ALKPHOS 43 01/31/2020   AST 18 01/31/2020   ALT 26 01/31/2020   PROT 6.7 01/31/2020   ALBUMIN 4.2 01/31/2020   CALCIUM 10.4 (H) 01/31/2020   Lab Results  Component Value Date   CHOL 158 01/31/2020   Lab Results  Component Value Date   HDL 56 01/31/2020   Lab Results  Component Value Date   LDLCALC 83 01/31/2020   Lab Results  Component Value Date    TRIG 105 01/31/2020   Lab Results  Component Value Date   CHOLHDL 2.8 01/31/2020   Lab Results  Component Value Date   HGBA1C 7.9 (H) 01/31/2020       Assessment & Plan:  1. Primary osteoarthritis of right knee Start meloxicam 15 mg once daily. Get knee x-ray - DG Knee Complete 4 Views Right; Future  2. Primary osteoarthritis of left knee Start meloxicam 15 mg once daily. Get knee x-ray - DG Knee Complete 4 Views Left; Future  3. Primary insomnia Start on trazodone 50 mg once at night for sleep.  4. Diabetic polyneuropathy associated with type 2 diabetes mellitus ([place]) START OZEMPIC 0.25 MG ONCE WEEKLY ON Tuesday.  CONTINUE JANUMET UNTIL RETURNS ON 6/14.  5. BMI 40.0-44.9, adult ([place])  6. Morbid obesity ([place]) Patient needs to actively work on eating healthy and exercising.  I realize this is difficult with his knee pain and back pain dizziness this has improved he should begin an exercise program.  In addition hopefully the Ozempic will help with weight loss.  7. Acute bilateral low back pain without sciatica Start meloxicam 15 mg once daily.  Start methocarbamol 500 mg 1 pill 3 times a day as needed for muscle spasms. Order  lumbar x-ray. - DG Lumbar Spine Complete; Future  Meds ordered this encounter  Medications  . methocarbamol (ROBAXIN) 500 MG tablet    Sig: Take 1 tablet (500 mg total) by mouth 3 (three) times daily.    Dispense:  90 tablet    Refill:  0  . traZODone (DESYREL) 50 MG tablet    Sig: Take 1 tablet (50 mg total) by mouth at bedtime.    Dispense:  90 tablet    Refill:  0    Orders Placed This Encounter  Procedures  . DG Lumbar Spine Complete  . DG Knee Complete 4 Views Right  . DG Knee Complete 4 Views Left     Follow-up: No follow-ups on file.  An After Visit Summary was printed and given to the patient.  Blane Ohara [facility] [phone]

## 2020-04-29 ENCOUNTER — Other Ambulatory Visit: Payer: Self-pay | Admitting: Family Medicine

## 2020-05-02 ENCOUNTER — Encounter: Payer: Self-pay | Admitting: Family Medicine

## 2020-05-02 DIAGNOSIS — M545 Low back pain, unspecified: Secondary | ICD-10-CM | POA: Insufficient documentation

## 2020-05-04 ENCOUNTER — Encounter: Payer: Self-pay | Admitting: Family Medicine

## 2020-05-04 ENCOUNTER — Ambulatory Visit (INDEPENDENT_AMBULATORY_CARE_PROVIDER_SITE_OTHER): Payer: PRIVATE HEALTH INSURANCE | Admitting: Family Medicine

## 2020-05-04 ENCOUNTER — Other Ambulatory Visit: Payer: Self-pay

## 2020-05-04 VITALS — BP 106/64 | HR 88 | Temp 97.7°F | Ht 69.0 in | Wt 300.2 lb

## 2020-05-04 DIAGNOSIS — E1142 Type 2 diabetes mellitus with diabetic polyneuropathy: Secondary | ICD-10-CM | POA: Diagnosis not present

## 2020-05-04 DIAGNOSIS — I10 Essential (primary) hypertension: Secondary | ICD-10-CM | POA: Diagnosis not present

## 2020-05-04 DIAGNOSIS — E1121 Type 2 diabetes mellitus with diabetic nephropathy: Secondary | ICD-10-CM

## 2020-05-04 DIAGNOSIS — Z6841 Body Mass Index (BMI) 40.0 and over, adult: Secondary | ICD-10-CM

## 2020-05-04 DIAGNOSIS — E782 Mixed hyperlipidemia: Secondary | ICD-10-CM

## 2020-05-04 NOTE — Patient Instructions (Addendum)
1. Hypertension: Hold lisinopril. Decrease metoprolol 25 mg 1/2 daily.  Check BP daily.   2. Diabetes: Increase ozempic to 0.5 mg once weekly.  Upon using all janumet, call and will change to metformin.  We are scheduling you an  appt with Karolee Stamps, our dietician. Please keep a food diary the week prior to your visit with Janelle.

## 2020-05-04 NOTE — Progress Notes (Signed)
Established Patient Office Visit  Subjective:  Patient ID: Russell Klein, male    DOB: May 22, 1961  Age: 59 y.o. MRN: [id number]  CC:  Chief Complaint  Patient presents with   Diabetes   Hypertension    HPI Jayse Hodkinson presents for diabetes. Checking sugar 1-2 times a day. [id number]. Taking jardiance 25 mg once daily, janumet 50/1000 mg one twice a day, actos 15 mg once daily, glipizide 5 mg once daily, and recently started on ozempic. The ozempic is helping him suppress his appetite.  He checks his feet daily.   Hypertension has been taking metoprolol. Bp had dropped low and I had held his lisinopril about 3 months ago. He is trying to eat healthy. His job is very active, but he does not exercise outside of work.   Continued back pain and knee pain. Scheduled to see Dr. Len Childs. Ortho will not do surgery unless pt's bmi is less than 40.Marland Kitchen Has some tramadol.   Hyperlipidemia: currently on lipitor 10 mg once daily.    Past Medical History:  Diagnosis Date   Back pain    Chronic pain syndrome    Diabetes ([place])    History of stomach ulcers    Hyperlipidemia    Hyperparathyroidism (HCC)    Hypertension    Primary insomnia    Pulmonary embolism (HCC)    Sleep apnea    Thyroid disease       Family History  Problem Relation Age of Onset   Stroke Mother    Diabetes Mother    Cushing syndrome Father    Thyroid disease Sister     Social History   Socioeconomic History   Marital status: Single    Spouse name: Not on file   Number of children: 2   Years of education: Not on file   Highest education level: Not on file  Occupational History    Comment: [employer]  Tobacco Use   Smoking status: Former Smoker    Types: Cigars    Quit date: 2013    Years since quitting: 8.4   Smokeless tobacco: Never Used  Scientific laboratory technician Use: Never used  Substance and Sexual Activity   Alcohol use: Not Currently   Drug use: Never   Sexual  activity: Not on file  Other Topics Concern   Not on file  Social History Narrative   Not on file   Social Determinants of Health   Financial Resource Strain:    Difficulty of Paying Living Expenses:   Food Insecurity:    Worried About Charity fundraiser in the Last Year:    Arboriculturist in the Last Year:   Transportation Needs:    Film/video editor (Medical):    Lack of Transportation (Non-Medical):   Physical Activity:    Days of Exercise per Week:    Minutes of Exercise per Session:   Stress:    Feeling of Stress :   Social Connections:    Frequency of Communication with Friends and Family:    Frequency of Social Gatherings with Friends and Family:    Attends Religious Services:    Active Member of Clubs or Organizations:    Attends Archivist Meetings:    Marital Status:   Intimate Partner Violence:    Fear of Current or Ex-Partner:    Emotionally Abused:    Physically Abused:    Sexually Abused:     Outpatient Medications Prior to Visit  Medication Sig Dispense Refill   aspirin EC 81 MG tablet Take 81 mg by mouth daily.     atorvastatin (LIPITOR) 10 MG tablet Take 1 tablet by mouth once daily 90 tablet 0   Cholecalciferol (VITAMIN D3) 5000 units TABS Take 1 tablet by mouth daily.     diclofenac Sodium (VOLTAREN) 1 % GEL Apply 4 grams to R knee 2 to 3 times a day prn pain     FLUoxetine (PROZAC) 20 MG tablet Take 20 mg by mouth daily.     fluticasone (FLONASE) 50 MCG/ACT nasal spray Place into the nose.     glipiZIDE (GLUCOTROL) 5 MG tablet TAKE 1 TABLET BY MOUTH ONCE DAILY IF FASTING BLOOD SUGAR IS OVER 150 30 tablet 0   JANUMET 50-1000 MG tablet TAKE 1 TABLET BY MOUTH TWICE DAILY WITH A MEAL 180 tablet 0   JARDIANCE 25 MG TABS tablet TAKE 1 TABLET BY MOUTH ONCE DAILY IN THE MORNING 90 tablet 0   lisinopril (ZESTRIL) 20 MG tablet Take 1 tablet by mouth once daily 90 tablet 0   loratadine (CLARITIN) 10 MG tablet Take  by mouth.     meloxicam (MOBIC) 15 MG tablet Take 15 mg by mouth daily.     methocarbamol (ROBAXIN) 500 MG tablet Take 1 tablet (500 mg total) by mouth 3 (three) times daily. 90 tablet 0   metoprolol succinate (TOPROL-XL) 25 MG 24 hr tablet Take 25 mg by mouth daily.  0   nitroGLYCERIN (NITROSTAT) 0.4 MG SL tablet Place 0.4 mg under the tongue every 5 (five) minutes as needed for chest pain.   1   pantoprazole (PROTONIX) 40 MG tablet Take 1 tablet (40 mg total) by mouth daily. 90 tablet 0   pioglitazone (ACTOS) 15 MG tablet Take 1 tablet by mouth once daily 90 tablet 0   traMADol (ULTRAM) 50 MG tablet      traZODone (DESYREL) 50 MG tablet Take 1 tablet (50 mg total) by mouth at bedtime. 90 tablet 0   No facility-administered medications prior to visit.    Allergies  Allergen Reactions   Invokamet [Canagliflozin-Metformin Hcl] Nausea Only    ROS Review of Systems  Constitutional: Negative for fatigue and fever.  HENT: Negative for congestion, ear pain, rhinorrhea and sore throat.   Respiratory: Negative for cough and shortness of breath.   Cardiovascular: Negative for chest pain and leg swelling.  Gastrointestinal: Positive for nausea. Negative for abdominal pain, constipation, diarrhea and vomiting.  Endocrine: Positive for polyphagia and polyuria. Negative for polydipsia.  Genitourinary: Negative for dysuria and urgency.  Musculoskeletal: Positive for arthralgias, back pain and myalgias.  Neurological: Positive for weakness and headaches. Negative for dizziness.  Psychiatric/Behavioral: Negative for dysphoric mood. The patient is not nervous/anxious.        No anhedonia.      Objective:    Physical Exam Constitutional:      Appearance: Normal appearance. He is well-developed. He is obese.  HENT:     Mouth/Throat:     Pharynx: No oropharyngeal exudate.  Neck:     Vascular: No carotid bruit.  Cardiovascular:     Rate and Rhythm: Normal rate and regular rhythm.      Heart sounds: Normal heart sounds.  Pulmonary:     Effort: Pulmonary effort is normal.     Breath sounds: Normal breath sounds.  Abdominal:     General: Abdomen is flat. Bowel sounds are normal.     Palpations: Abdomen is soft.  Tenderness: There is no abdominal tenderness.  Neurological:     Mental Status: He is alert and oriented to person, place, and time.  Psychiatric:        Mood and Affect: Mood normal.        Behavior: Behavior normal.    Diabetic Foot Exam - Simple   Simple Foot Form Diabetic Foot exam was performed with the following findings: Yes 05/04/2020  9:30 AM  Visual Inspection No deformities, no ulcerations, no other skin breakdown bilaterally: Yes Sensation Testing Intact to touch and monofilament testing bilaterally: Yes Pulse Check Posterior Tibialis and Dorsalis pulse intact bilaterally: Yes Comments Long toe nails.      BP 106/64    Pulse 88    Temp 97.7 F (36.5 C)    Ht 5\' 9"  (1.753 m)    Wt (!) 300 lb 3.2 oz (136.2 kg)    BMI 44.33 kg/m  Wt Readings from Last 3 Encounters:  05/04/20 (!) 300 lb 3.2 oz (136.2 kg)  04/21/20 299 lb (135.6 kg)  03/03/20 299 lb (135.6 kg)     Health Maintenance Due  Topic Date Due   Hepatitis C Screening  Never done   PNEUMOCOCCAL POLYSACCHARIDE VACCINE AGE 14-64 HIGH RISK  Never done   OPHTHALMOLOGY EXAM  Never done   HIV Screening  Never done   TETANUS/TDAP  Never done   COLONOSCOPY  Never done    There are no preventive care reminders to display for this patient.  Lab Results  Component Value Date   TSH 3.120 01/31/2020   Lab Results  Component Value Date   WBC 9.7 01/31/2020   HGB 14.3 01/31/2020   HCT 43.9 01/31/2020   MCV 90 01/31/2020   PLT 289 01/31/2020   Lab Results  Component Value Date   NA 138 01/31/2020   K 5.2 01/31/2020   CO2 20 01/31/2020   GLUCOSE 188 (H) 01/31/2020   BUN 23 01/31/2020   CREATININE 1.19 01/31/2020   BILITOT <0.2 01/31/2020   ALKPHOS 43 01/31/2020    AST 18 01/31/2020   ALT 26 01/31/2020   PROT 6.7 01/31/2020   ALBUMIN 4.2 01/31/2020   CALCIUM 10.4 (H) 01/31/2020   Lab Results  Component Value Date   CHOL 158 01/31/2020   Lab Results  Component Value Date   HDL 56 01/31/2020   Lab Results  Component Value Date   LDLCALC 83 01/31/2020   Lab Results  Component Value Date   TRIG 105 01/31/2020   Lab Results  Component Value Date   CHOLHDL 2.8 01/31/2020   Lab Results  Component Value Date   HGBA1C 7.9 (H) 01/31/2020      Assessment & Plan:  1. Essential hypertension - too low. Hold lisinopril. Decrease metoprolol to 25 mg 1/2 daily.  Check bp daily.  Labs collected- CBC, CMP  2. Diabetic glomerulopathy (HCC) Control:  Improved. Recommend check sugars fasting daily. Recommend check feet daily. Recommend annual eye exams. Medicines: complete janumet then change to metformin. Increase ozempic to 0.5 mg once weekly. Continue to work on eating a healthy diet and exercise.  Labs drawn today.   Labs collected- A1C Refer to in house dietician, janelle.  3. Dyslipidemia The current medical regimen is effective; continue present plan and medications. Recommend continue to work on eating healthy diet and exercise. Labs collected-Lipid  4. OA knees/lumbar back pain Work on weight loss.  Keep appt with ortho.   5. Morbid obesity bmi 44 Recommend continue to  work on eating healthy diet and exercise. Pt needs to get down to weight of 240 to proceed with surgery. Referred to diabetes dietician.  Follow-up: No follow-ups on file.    Blane Ohara, MD

## 2020-05-05 ENCOUNTER — Encounter: Payer: Self-pay | Admitting: Family Medicine

## 2020-05-05 DIAGNOSIS — E782 Mixed hyperlipidemia: Secondary | ICD-10-CM | POA: Insufficient documentation

## 2020-05-05 DIAGNOSIS — E1121 Type 2 diabetes mellitus with diabetic nephropathy: Secondary | ICD-10-CM | POA: Insufficient documentation

## 2020-05-05 LAB — COMPREHENSIVE METABOLIC PANEL
ALT: 23 IU/L (ref 0–44)
AST: 15 IU/L (ref 0–40)
Albumin/Globulin Ratio: 1.4 (ref 1.2–2.2)
Albumin: 4 g/dL (ref 3.8–4.9)
Alkaline Phosphatase: 38 IU/L — ABNORMAL LOW (ref 48–121)
BUN/Creatinine Ratio: 20 (ref 9–20)
BUN: 25 mg/dL — ABNORMAL HIGH (ref 6–24)
Bilirubin Total: 0.2 mg/dL (ref 0.0–1.2)
CO2: 19 mmol/L — ABNORMAL LOW (ref 20–29)
Calcium: 9.9 mg/dL (ref 8.7–10.2)
Chloride: 104 mmol/L (ref 96–106)
Creatinine, Ser: 1.27 mg/dL (ref 0.76–1.27)
GFR calc Af Amer: 72 mL/min/{1.73_m2} (ref >59)
GFR calc non Af Amer: 62 mL/min/{1.73_m2} (ref >59)
Globulin, Total: 2.9 g/dL (ref 1.5–4.5)
Glucose: 140 mg/dL — ABNORMAL HIGH (ref 65–99)
Potassium: 4.9 mmol/L (ref 3.5–5.2)
Sodium: 138 mmol/L (ref 134–144)
Total Protein: 6.9 g/dL (ref 6.0–8.5)

## 2020-05-05 LAB — LIPID PANEL
Chol/HDL Ratio: 3.5 ratio (ref 0.0–5.0)
Cholesterol, Total: 168 mg/dL (ref 100–199)
HDL: 48 mg/dL (ref >39)
LDL Chol Calc (NIH): 100 mg/dL — ABNORMAL HIGH (ref 0–99)
Triglycerides: 109 mg/dL (ref 0–149)
VLDL Cholesterol Cal: 20 mg/dL (ref 5–40)

## 2020-05-05 LAB — CBC WITH DIFFERENTIAL/PLATELET
Basophils Absolute: 0.1 10*3/uL (ref 0.0–0.2)
Basos: 1 %
EOS (ABSOLUTE): 0.1 10*3/uL (ref 0.0–0.4)
Eos: 1 %
Hematocrit: 44.5 % (ref 37.5–51.0)
Hemoglobin: 14.4 g/dL (ref 13.0–17.7)
Immature Grans (Abs): 0.1 10*3/uL (ref 0.0–0.1)
Immature Granulocytes: 1 %
Lymphocytes Absolute: 1.5 10*3/uL (ref 0.7–3.1)
Lymphs: 17 %
MCH: 28.8 pg (ref 26.6–33.0)
MCHC: 32.4 g/dL (ref 31.5–35.7)
MCV: 89 fL (ref 79–97)
Monocytes Absolute: 0.7 10*3/uL (ref 0.1–0.9)
Monocytes: 8 %
Neutrophils Absolute: 6.2 10*3/uL (ref 1.4–7.0)
Neutrophils: 72 %
Platelets: 264 10*3/uL (ref 150–450)
RBC: 5 x10E6/uL (ref 4.14–5.80)
RDW: 14.6 % (ref 11.6–15.4)
WBC: 8.5 10*3/uL (ref 3.4–10.8)

## 2020-05-05 LAB — HEMOGLOBIN A1C
Est. average glucose Bld gHb Est-mCnc: 186 mg/dL
Hgb A1c MFr Bld: 8.1 % — ABNORMAL HIGH (ref 4.8–5.6)

## 2020-05-05 LAB — CARDIOVASCULAR RISK ASSESSMENT

## 2020-05-08 ENCOUNTER — Other Ambulatory Visit: Payer: Self-pay

## 2020-05-08 MED ORDER — ATORVASTATIN CALCIUM 20 MG PO TABS: 20.0000 mg | ORAL_TABLET | Freq: Every day | ORAL | 0 refills | Status: DC

## 2020-05-10 ENCOUNTER — Other Ambulatory Visit: Payer: Self-pay | Admitting: Family Medicine

## 2020-05-10 ENCOUNTER — Other Ambulatory Visit: Payer: Self-pay | Admitting: Physician Assistant

## 2020-05-21 ENCOUNTER — Ambulatory Visit: Payer: PRIVATE HEALTH INSURANCE

## 2020-05-21 ENCOUNTER — Other Ambulatory Visit: Payer: Self-pay

## 2020-05-21 MED ORDER — OZEMPIC (0.25 OR 0.5 MG/DOSE) 2 MG/1.5ML ~~LOC~~ SOPN: 0.5000 mg | PEN_INJECTOR | SUBCUTANEOUS | 0 refills | Status: DC

## 2020-05-28 ENCOUNTER — Telehealth: Payer: Self-pay

## 2020-05-28 NOTE — Telephone Encounter (Signed)
Ozempic approved from 05/22/2020 to 05/25/2021

## 2020-06-01 ENCOUNTER — Telehealth: Payer: Self-pay

## 2020-06-01 NOTE — Telephone Encounter (Signed)
PA for Ozempic submitted and approved via covermymeds. 

## 2020-06-17 ENCOUNTER — Other Ambulatory Visit: Payer: Self-pay | Admitting: Family Medicine

## 2020-06-25 ENCOUNTER — Other Ambulatory Visit: Payer: Self-pay | Admitting: Family Medicine

## 2020-07-10 ENCOUNTER — Other Ambulatory Visit: Payer: Self-pay | Admitting: Family Medicine

## 2020-07-27 ENCOUNTER — Other Ambulatory Visit: Payer: Self-pay | Admitting: Family Medicine

## 2020-07-27 ENCOUNTER — Other Ambulatory Visit: Payer: Self-pay | Admitting: Physician Assistant

## 2020-08-06 ENCOUNTER — Encounter: Payer: Self-pay | Admitting: Family Medicine

## 2020-08-06 ENCOUNTER — Other Ambulatory Visit: Payer: Self-pay

## 2020-08-06 ENCOUNTER — Ambulatory Visit (INDEPENDENT_AMBULATORY_CARE_PROVIDER_SITE_OTHER): Payer: PRIVATE HEALTH INSURANCE | Admitting: Family Medicine

## 2020-08-06 VITALS — BP 110/70 | HR 94 | Temp 97.6°F | Ht 69.0 in | Wt 288.0 lb

## 2020-08-06 DIAGNOSIS — Z23 Encounter for immunization: Secondary | ICD-10-CM

## 2020-08-06 DIAGNOSIS — M1711 Unilateral primary osteoarthritis, right knee: Secondary | ICD-10-CM | POA: Diagnosis not present

## 2020-08-06 DIAGNOSIS — M1712 Unilateral primary osteoarthritis, left knee: Secondary | ICD-10-CM | POA: Diagnosis not present

## 2020-08-06 DIAGNOSIS — M1710 Unilateral primary osteoarthritis, unspecified knee: Secondary | ICD-10-CM | POA: Diagnosis not present

## 2020-08-06 DIAGNOSIS — E1142 Type 2 diabetes mellitus with diabetic polyneuropathy: Secondary | ICD-10-CM

## 2020-08-06 DIAGNOSIS — I10 Essential (primary) hypertension: Secondary | ICD-10-CM

## 2020-08-06 DIAGNOSIS — E782 Mixed hyperlipidemia: Secondary | ICD-10-CM | POA: Diagnosis not present

## 2020-08-06 MED ORDER — OZEMPIC (1 MG/DOSE) 2 MG/1.5ML ~~LOC~~ SOPN: 1.0000 mg | PEN_INJECTOR | SUBCUTANEOUS | 2 refills | Status: DC

## 2020-08-06 NOTE — Patient Instructions (Addendum)
Ozempic Increase to 0.5 mg once weekly x 2 weeks. Then increase Ozempic 1 mg one daily. Complete janumet, when gone, discontinue it.

## 2020-08-06 NOTE — Progress Notes (Signed)
Established Patient Office Visit  Subjective:  Patient ID: Russell Klein, male    DOB: 1961/04/03  Age: 59 y.o. MRN: [id number]  CC:  Chief Complaint  Patient presents with  . Diabetes  . Hypertension  . Hyperlipidemia    HPI Russell Klein presents for diabetes. Checking sugar 1-2 times a day. 109-149. Taking jardiance 25 mg once daily, janumet 50/1000 mg one twice a day, stopped actos daily, stopped glipizide, and ozempic 0.25 mg weeky. The ozempic is helping him suppress his appetite.  He checks his feet daily.   Hypertension has been taking metoprolol. Bp had dropped low and I had held his lisinopril about 3 months ago Patient has since then started taking 1/2 a tablet of Lisinopril daily.He is trying to eat healthy. His job is very active, but he does not exercise outside of work.   Continued back pain and knee pain. Scheduled to see Dr. Thamas Jaegers. Ortho will not do surgery unless pt's bmi is less than 40.Marland Kitchen Has some tramadol.   Hyperlipidemia: currently on lipitor 20 mg once daily. Eating low fat diet.   Past Medical History:  Diagnosis Date  . Back pain   . Chronic pain syndrome   . Diabetes ([facility])   . History of stomach ulcers   . Hyperlipidemia   . Hyperparathyroidism (HCC)   . Hypertension   . Primary insomnia   . Pulmonary embolism (HCC)   . Sleep apnea   . Thyroid disease       Family History  Problem Relation Age of Onset  . Stroke Mother   . Diabetes Mother   . Cushing syndrome Father   . Thyroid disease Sister     Social History   Socioeconomic History  . Marital status: Single    Spouse name: Not on file  . Number of children: 2  . Years of education: Not on file  . Highest education level: Not on file  Occupational History    Comment: [employer]  Tobacco Use  . Smoking status: Former Smoker    Types: Cigars    Quit date: 2013    Years since quitting: 8.7  . Smokeless tobacco: Never Used  Vaping Use  . Vaping Use: Never used    Substance and Sexual Activity  . Alcohol use: Not Currently  . Drug use: Never  . Sexual activity: Not on file  Other Topics Concern  . Not on file  Social History Narrative  . Not on file   Social Determinants of Health   Financial Resource Strain:   . Difficulty of Paying Living Expenses: Not on file  Food Insecurity:   . Worried About Programme researcher, broadcasting/film/video in the Last Year: Not on file  . Ran Out of Food in the Last Year: Not on file  Transportation Needs:   . Lack of Transportation (Medical): Not on file  . Lack of Transportation (Non-Medical): Not on file  Physical Activity:   . Days of Exercise per Week: Not on file  . Minutes of Exercise per Session: Not on file  Stress:   . Feeling of Stress : Not on file  Social Connections:   . Frequency of Communication with Friends and Family: Not on file  . Frequency of Social Gatherings with Friends and Family: Not on file  . Attends Religious Services: Not on file  . Active Member of Clubs or Organizations: Not on file  . Attends Banker Meetings: Not on file  .  Marital Status: Not on file  Intimate Partner Violence:   . Fear of Current or Ex-Partner: Not on file  . Emotionally Abused: Not on file  . Physically Abused: Not on file  . Sexually Abused: Not on file    Outpatient Medications Prior to Visit  Medication Sig Dispense Refill  . aspirin EC 81 MG tablet Take 81 mg by mouth daily.    Marland Kitchen. atorvastatin (LIPITOR) 20 MG tablet Take 1 tablet (20 mg total) by mouth daily. 90 tablet 0  . Cholecalciferol (VITAMIN D3) 5000 units TABS Take 1 tablet by mouth daily.    . diclofenac Sodium (VOLTAREN) 1 % GEL Apply 4 grams to R knee 2 to 3 times a day prn pain    . FLUoxetine (PROZAC) 20 MG tablet Take 20 mg by mouth daily.    . fluticasone (FLONASE) 50 MCG/ACT nasal spray Place into the nose.    Marland Kitchen. glipiZIDE (GLUCOTROL) 5 MG tablet TAKE 1 TABLET BY MOUTH ONCE DAILY IF FASTING BLOOD SUGAR IS OVER 150 30 tablet 0  .  JANUMET 50-1000 MG tablet TAKE 1 TABLET BY MOUTH TWICE DAILY WITH A MEAL 180 tablet 0  . JARDIANCE 25 MG TABS tablet TAKE 1 TABLET BY MOUTH ONCE DAILY IN THE MORNING 90 tablet 0  . lisinopril (ZESTRIL) 20 MG tablet Take 1 tablet by mouth once daily 90 tablet 1  . loratadine (CLARITIN) 10 MG tablet Take by mouth.    . meloxicam (MOBIC) 15 MG tablet Take 15 mg by mouth daily.    . methocarbamol (ROBAXIN) 500 MG tablet Take 1 tablet (500 mg total) by mouth 3 (three) times daily. 90 tablet 0  . metoprolol succinate (TOPROL-XL) 25 MG 24 hr tablet Take 25 mg by mouth daily.  0  . nitroGLYCERIN (NITROSTAT) 0.4 MG SL tablet Place 0.4 mg under the tongue every 5 (five) minutes as needed for chest pain.   1  . pantoprazole (PROTONIX) 40 MG tablet Take 1 tablet (40 mg total) by mouth daily. 90 tablet 0  . traMADol (ULTRAM) 50 MG tablet     . traZODone (DESYREL) 50 MG tablet Take 1 tablet (50 mg total) by mouth at bedtime. 90 tablet 0  . atorvastatin (LIPITOR) 10 MG tablet Take 1 tablet by mouth once daily 90 tablet 1  . pioglitazone (ACTOS) 15 MG tablet Take 1 tablet by mouth once daily 90 tablet 0  . Semaglutide,0.25 or 0.5MG /DOS, (OZEMPIC, 0.25 OR 0.5 MG/DOSE,) 2 MG/1.5ML SOPN Inject 0.375 mLs (0.5 mg total) into the skin once a week. 4.5 mL 0   No facility-administered medications prior to visit.    Allergies  Allergen Reactions  . Invokamet [Canagliflozin-Metformin Hcl] Nausea Only    ROS Review of Systems  Constitutional: Negative for fatigue and fever.  HENT: Positive for congestion. Negative for ear pain, rhinorrhea and sore throat.   Respiratory: Negative for cough and shortness of breath.   Cardiovascular: Negative for chest pain and leg swelling.  Gastrointestinal: Negative for abdominal pain, constipation, diarrhea, nausea and vomiting.  Endocrine: Negative for polydipsia, polyphagia and polyuria.  Genitourinary: Negative for dysuria and urgency.  Musculoskeletal: Positive for  arthralgias (BL knee pain). Negative for back pain and myalgias.  Neurological: Negative for dizziness, weakness and headaches.  Psychiatric/Behavioral: Negative for dysphoric mood. The patient is not nervous/anxious.        No anhedonia.      Objective:    Physical Exam Constitutional:      Appearance: Normal appearance.  He is well-developed. He is obese.  Neck:     Vascular: No carotid bruit.  Cardiovascular:     Rate and Rhythm: Normal rate and regular rhythm.     Heart sounds: Normal heart sounds.  Pulmonary:     Effort: Pulmonary effort is normal.     Breath sounds: Normal breath sounds.  Abdominal:     General: Abdomen is flat. Bowel sounds are normal.     Palpations: Abdomen is soft.     Tenderness: There is no abdominal tenderness.  Neurological:     Mental Status: He is alert and oriented to person, place, and time.  Psychiatric:        Mood and Affect: Mood normal.        Behavior: Behavior normal.    Diabetic Foot Exam - Simple   Simple Foot Form Visual Inspection No deformities, no ulcerations, no other skin breakdown bilaterally: Yes Sensation Testing Intact to touch and monofilament testing bilaterally: Yes Pulse Check Posterior Tibialis and Dorsalis pulse intact bilaterally: Yes Comments      BP 110/70   Pulse 94   Temp 97.6 F (36.4 C)   Ht 5\' 9"  (1.753 m)   Wt 288 lb (130.6 kg)   SpO2 98%   BMI 42.53 kg/m  Wt Readings from Last 3 Encounters:  08/06/20 288 lb (130.6 kg)  05/04/20 (!) 300 lb 3.2 oz (136.2 kg)  04/21/20 299 lb (135.6 kg)     Health Maintenance Due  Topic Date Due  . Hepatitis C Screening  Never done  . PNEUMOCOCCAL POLYSACCHARIDE VACCINE AGE 32-64 HIGH RISK  Never done  . OPHTHALMOLOGY EXAM  Never done  . HIV Screening  Never done  . COLONOSCOPY  Never done    There are no preventive care reminders to display for this patient.  Lab Results  Component Value Date   TSH 3.120 01/31/2020   Lab Results  Component  Value Date   WBC 7.9 08/06/2020   HGB 14.1 08/06/2020   HCT 43.5 08/06/2020   MCV 90 08/06/2020   PLT 288 08/06/2020   Lab Results  Component Value Date   NA 142 08/06/2020   K 4.8 08/06/2020   CO2 22 08/06/2020   GLUCOSE 131 (H) 08/06/2020   BUN 17 08/06/2020   CREATININE 0.96 08/06/2020   BILITOT 0.3 08/06/2020   ALKPHOS 39 (L) 08/06/2020   AST 16 08/06/2020   ALT 32 08/06/2020   PROT 6.8 08/06/2020   ALBUMIN 4.2 08/06/2020   CALCIUM 10.3 (H) 08/06/2020   Lab Results  Component Value Date   CHOL 148 08/06/2020   Lab Results  Component Value Date   HDL 51 08/06/2020   Lab Results  Component Value Date   LDLCALC 79 08/06/2020   Lab Results  Component Value Date   TRIG 95 08/06/2020   Lab Results  Component Value Date   CHOLHDL 2.9 08/06/2020   Lab Results  Component Value Date   HGBA1C 6.8 (H) 08/06/2020      Assessment & Plan:  1. Essential hypertension Well controlled.  No changes to medicines.  Continue to work on eating a healthy diet.08/08/2020  Labs drawn today.  - CBC with Differential/Platelet - Comprehensive metabolic panel  2. Mixed hyperlipidemia Well controlled.  No changes to medicines.  Continue to work on eating a healthy diet.  Labs drawn today.  - Lipid panel  3. Primary osteoarthritis of right knee Work on weight loss.   4. Primary osteoarthritis of  left knee Work on weight loss.  5. Diabetic polyneuropathy associated with type 2 diabetes mellitus (HCC) Control: improved Recommend check sugars fasting daily. Recommend check feet daily. Recommend annual eye exams. Medicines: increase ozempic 0.5 mg once weekly x 2 weeks, then increase ozempic to 1 mg once weekly. Complete janumet, when gone, discontinue it. Continue to work on eating a healthy diet.  Labs drawn today.   - Hemoglobin A1c  6. Encounter for immunization - Flu Vaccine MDCK QUAD PF  Follow-up: Return in about 5 weeks (around 09/10/2020) for not  fasting.    Blane Ohara, MD

## 2020-08-07 LAB — COMPREHENSIVE METABOLIC PANEL
ALT: 32 IU/L (ref 0–44)
AST: 16 IU/L (ref 0–40)
Albumin/Globulin Ratio: 1.6 (ref 1.2–2.2)
Albumin: 4.2 g/dL (ref 3.8–4.9)
Alkaline Phosphatase: 39 IU/L — ABNORMAL LOW (ref 44–121)
BUN/Creatinine Ratio: 18 (ref 9–20)
BUN: 17 mg/dL (ref 6–24)
Bilirubin Total: 0.3 mg/dL (ref 0.0–1.2)
CO2: 22 mmol/L (ref 20–29)
Calcium: 10.3 mg/dL — ABNORMAL HIGH (ref 8.7–10.2)
Chloride: 105 mmol/L (ref 96–106)
Creatinine, Ser: 0.96 mg/dL (ref 0.76–1.27)
GFR calc Af Amer: 100 mL/min/{1.73_m2} (ref >59)
GFR calc non Af Amer: 87 mL/min/{1.73_m2} (ref >59)
Globulin, Total: 2.6 g/dL (ref 1.5–4.5)
Glucose: 131 mg/dL — ABNORMAL HIGH (ref 65–99)
Potassium: 4.8 mmol/L (ref 3.5–5.2)
Sodium: 142 mmol/L (ref 134–144)
Total Protein: 6.8 g/dL (ref 6.0–8.5)

## 2020-08-07 LAB — LIPID PANEL
Chol/HDL Ratio: 2.9 ratio (ref 0.0–5.0)
Cholesterol, Total: 148 mg/dL (ref 100–199)
HDL: 51 mg/dL (ref >39)
LDL Chol Calc (NIH): 79 mg/dL (ref 0–99)
Triglycerides: 95 mg/dL (ref 0–149)
VLDL Cholesterol Cal: 18 mg/dL (ref 5–40)

## 2020-08-07 LAB — CBC WITH DIFFERENTIAL/PLATELET
Basophils Absolute: 0.1 10*3/uL (ref 0.0–0.2)
Basos: 1 %
EOS (ABSOLUTE): 0.1 10*3/uL (ref 0.0–0.4)
Eos: 2 %
Hematocrit: 43.5 % (ref 37.5–51.0)
Hemoglobin: 14.1 g/dL (ref 13.0–17.7)
Immature Grans (Abs): 0.1 10*3/uL (ref 0.0–0.1)
Immature Granulocytes: 1 %
Lymphocytes Absolute: 1.4 10*3/uL (ref 0.7–3.1)
Lymphs: 18 %
MCH: 29 pg (ref 26.6–33.0)
MCHC: 32.4 g/dL (ref 31.5–35.7)
MCV: 90 fL (ref 79–97)
Monocytes Absolute: 0.7 10*3/uL (ref 0.1–0.9)
Monocytes: 9 %
Neutrophils Absolute: 5.5 10*3/uL (ref 1.4–7.0)
Neutrophils: 69 %
Platelets: 288 10*3/uL (ref 150–450)
RBC: 4.86 x10E6/uL (ref 4.14–5.80)
RDW: 14.7 % (ref 11.6–15.4)
WBC: 7.9 10*3/uL (ref 3.4–10.8)

## 2020-08-07 LAB — HEMOGLOBIN A1C
Est. average glucose Bld gHb Est-mCnc: 148 mg/dL
Hgb A1c MFr Bld: 6.8 % — ABNORMAL HIGH (ref 4.8–5.6)

## 2020-08-07 LAB — CARDIOVASCULAR RISK ASSESSMENT

## 2020-09-01 ENCOUNTER — Ambulatory Visit (INDEPENDENT_AMBULATORY_CARE_PROVIDER_SITE_OTHER): Payer: PRIVATE HEALTH INSURANCE | Admitting: Family Medicine

## 2020-09-01 ENCOUNTER — Other Ambulatory Visit: Payer: Self-pay

## 2020-09-01 VITALS — BP 130/72 | HR 100 | Temp 98.2°F | Resp 18 | Ht 69.0 in | Wt 283.2 lb

## 2020-09-01 DIAGNOSIS — M545 Low back pain, unspecified: Secondary | ICD-10-CM

## 2020-09-01 MED ORDER — MELOXICAM 15 MG PO TABS: 15.0000 mg | ORAL_TABLET | Freq: Every day | ORAL | 0 refills | Status: DC

## 2020-09-01 MED ORDER — TRAMADOL-ACETAMINOPHEN 37.5-325 MG PO TABS: 2.0000 | ORAL_TABLET | Freq: Four times a day (QID) | ORAL | 0 refills | Status: DC | PRN

## 2020-09-01 MED ORDER — METHOCARBAMOL 750 MG PO TABS: 750.0000 mg | ORAL_TABLET | Freq: Four times a day (QID) | ORAL | 0 refills | Status: DC

## 2020-09-01 NOTE — Progress Notes (Signed)
Acute Office Visit  Subjective:    Patient ID: Russell Klein, male    DOB: 28-Dec-1960, 59 y.o.   MRN: [id number]  Chief Complaint  Patient presents with  . Back Pain    HPI Patient is in today for low back pain which started 8 days ago.  He had been moving steel beams with his four-wheeler and he felt a pinching sensation in his low back. He has been in bed for a week with severe pain.  His pain level has been 8-10 on a regular basis. No radiculopathy.   Past Medical History:  Diagnosis Date  . Back pain   . Chronic pain syndrome   . Diabetes (HCC)   . History of stomach ulcers   . Hyperlipidemia   . Hyperparathyroidism (HCC)   . Hypertension   . Primary insomnia   . Pulmonary embolism (HCC)   . Sleep apnea   . Thyroid disease     Past Surgical History:  Procedure Laterality Date  . APPENDECTOMY    . Exploratory Laporatomy    . LEFT HEART CATH AND CORONARY ANGIOGRAPHY N/A 09/18/2018   Procedure: LEFT HEART CATH AND CORONARY ANGIOGRAPHY;  Surgeon: Marykay Lex, MD;  Location: [facility] INVASIVE CV LAB;  Service: Cardiovascular;  Laterality: N/A;  . PARATHYROIDECTOMY      Family History  Problem Relation Age of Onset  . Stroke Mother   . Diabetes Mother   . Cushing syndrome Father   . Thyroid disease Sister     Social History   Socioeconomic History  . Marital status: Single    Spouse name: Not on file  . Number of children: 2  . Years of education: Not on file  . Highest education level: Not on file  Occupational History    Comment: [employer]  Tobacco Use  . Smoking status: Former Smoker    Types: Cigars    Quit date: 2013    Years since quitting: 8.7  . Smokeless tobacco: Never Used  Vaping Use  . Vaping Use: Never used  Substance and Sexual Activity  . Alcohol use: Not Currently  . Drug use: Never  . Sexual activity: Not on file  Other Topics Concern  . Not on file  Social History Narrative  . Not on file   Social Determinants of  Health   Financial Resource Strain:   . Difficulty of Paying Living Expenses: Not on file  Food Insecurity:   . Worried About Programme researcher, broadcasting/film/video in the Last Year: Not on file  . Ran Out of Food in the Last Year: Not on file  Transportation Needs:   . Lack of Transportation (Medical): Not on file  . Lack of Transportation (Non-Medical): Not on file  Physical Activity:   . Days of Exercise per Week: Not on file  . Minutes of Exercise per Session: Not on file  Stress:   . Feeling of Stress : Not on file  Social Connections:   . Frequency of Communication with Friends and Family: Not on file  . Frequency of Social Gatherings with Friends and Family: Not on file  . Attends Religious Services: Not on file  . Active Member of Clubs or Organizations: Not on file  . Attends Banker Meetings: Not on file  . Marital Status: Not on file  Intimate Partner Violence:   . Fear of Current or Ex-Partner: Not on file  . Emotionally Abused: Not on file  . Physically Abused: Not  on file  . Sexually Abused: Not on file    Outpatient Medications Prior to Visit  Medication Sig Dispense Refill  . aspirin EC 81 MG tablet Take 81 mg by mouth daily.    Marland Kitchen atorvastatin (LIPITOR) 20 MG tablet Take 1 tablet (20 mg total) by mouth daily. 90 tablet 0  . Cholecalciferol (VITAMIN D3) 5000 units TABS Take 1 tablet by mouth daily.    . diclofenac Sodium (VOLTAREN) 1 % GEL Apply 4 grams to R knee 2 to 3 times a day prn pain    . FLUoxetine (PROZAC) 20 MG tablet Take 20 mg by mouth daily.    . fluticasone (FLONASE) 50 MCG/ACT nasal spray Place into the nose.    Marland Kitchen JANUMET 50-1000 MG tablet TAKE 1 TABLET BY MOUTH TWICE DAILY WITH A MEAL 180 tablet 0  . JARDIANCE 25 MG TABS tablet TAKE 1 TABLET BY MOUTH ONCE DAILY IN THE MORNING 90 tablet 0  . lisinopril (ZESTRIL) 20 MG tablet Take 1 tablet by mouth once daily 90 tablet 1  . loratadine (CLARITIN) 10 MG tablet Take by mouth.    . metoprolol succinate  (TOPROL-XL) 25 MG 24 hr tablet Take 25 mg by mouth daily.  0  . nitroGLYCERIN (NITROSTAT) 0.4 MG SL tablet Place 0.4 mg under the tongue every 5 (five) minutes as needed for chest pain.   1  . pantoprazole (PROTONIX) 40 MG tablet Take 1 tablet (40 mg total) by mouth daily. 90 tablet 0  . Semaglutide, 1 MG/DOSE, (OZEMPIC, 1 MG/DOSE,) 2 MG/1.5ML SOPN Inject 1 mg into the skin once a week. 1.5 mL 2  . traZODone (DESYREL) 50 MG tablet Take 1 tablet (50 mg total) by mouth at bedtime. 90 tablet 0  . glipiZIDE (GLUCOTROL) 5 MG tablet TAKE 1 TABLET BY MOUTH ONCE DAILY IF FASTING BLOOD SUGAR IS OVER 150 30 tablet 0  . meloxicam (MOBIC) 15 MG tablet Take 15 mg by mouth daily.    . methocarbamol (ROBAXIN) 500 MG tablet Take 1 tablet (500 mg total) by mouth 3 (three) times daily. 90 tablet 0  . traMADol (ULTRAM) 50 MG tablet      No facility-administered medications prior to visit.    Allergies  Allergen Reactions  . Invokamet [Canagliflozin-Metformin Hcl] Nausea Only    Review of Systems  Constitutional: Positive for unexpected weight change. Negative for chills, fatigue and fever.  HENT: Negative for congestion, ear pain and sinus pain.   Respiratory: Negative for cough and shortness of breath.   Cardiovascular: Negative for chest pain and palpitations.  Gastrointestinal: Negative for abdominal pain, constipation and diarrhea.  Genitourinary: Negative for dysuria and frequency.  Musculoskeletal: Positive for arthralgias (had knee injections this morning with orthopedics.) and back pain.  Neurological: Negative for dizziness, weakness and headaches.  Psychiatric/Behavioral: Negative for dysphoric mood. The patient is not nervous/anxious.        Objective:    Physical Exam Vitals reviewed.  Constitutional:      Appearance: Normal appearance.  Cardiovascular:     Rate and Rhythm: Normal rate and regular rhythm.     Pulses: Normal pulses.     Heart sounds: Normal heart sounds.  Pulmonary:       Effort: Pulmonary effort is normal.     Breath sounds: Normal breath sounds. No wheezing, rhonchi or rales.  Musculoskeletal:        General: Tenderness (lumbar paraspinal muscle and lumbar spine midline. Flexion at waist limited. No SLR BL. ) present.  Neurological:     Mental Status: He is alert and oriented to person, place, and time.  Psychiatric:        Mood and Affect: Mood normal.        Behavior: Behavior normal.     BP 130/72   Pulse 100   Temp 98.2 F (36.8 C)   Resp 18   Ht 5\' 9"  (1.753 m)   Wt 283 lb 3.2 oz (128.5 kg)   BMI 41.82 kg/m  Wt Readings from Last 3 Encounters:  09/01/20 283 lb 3.2 oz (128.5 kg)  08/06/20 288 lb (130.6 kg)  05/04/20 (!) 300 lb 3.2 oz (136.2 kg)    Health Maintenance Due  Topic Date Due  . Hepatitis C Screening  Never done  . PNEUMOCOCCAL POLYSACCHARIDE VACCINE AGE 58-64 HIGH RISK  Never done  . OPHTHALMOLOGY EXAM  Never done  . HIV Screening  Never done  . COLONOSCOPY  Never done    There are no preventive care reminders to display for this patient.   Lab Results  Component Value Date   TSH 3.120 01/31/2020   Lab Results  Component Value Date   WBC 7.9 08/06/2020   HGB 14.1 08/06/2020   HCT 43.5 08/06/2020   MCV 90 08/06/2020   PLT 288 08/06/2020   Lab Results  Component Value Date   NA 142 08/06/2020   K 4.8 08/06/2020   CO2 22 08/06/2020   GLUCOSE 131 (H) 08/06/2020   BUN 17 08/06/2020   CREATININE 0.96 08/06/2020   BILITOT 0.3 08/06/2020   ALKPHOS 39 (L) 08/06/2020   AST 16 08/06/2020   ALT 32 08/06/2020   PROT 6.8 08/06/2020   ALBUMIN 4.2 08/06/2020   CALCIUM 10.3 (H) 08/06/2020   Lab Results  Component Value Date   CHOL 148 08/06/2020   Lab Results  Component Value Date   HDL 51 08/06/2020   Lab Results  Component Value Date   LDLCALC 79 08/06/2020   Lab Results  Component Value Date   TRIG 95 08/06/2020   Lab Results  Component Value Date   CHOLHDL 2.9 08/06/2020   Lab Results   Component Value Date   HGBA1C 6.8 (H) 08/06/2020       Assessment & Plan:  1. Acute midline low back pain without sciatica Start on meloxicam 15 mg once daily.  Do not take Advil (ibuprofen) or Aleve (naproxen) Ultracet 1-2 every 6 hours as needed for severe pain Methocarbamol 750 mg 1 every 6 hours as needed for severe muscle pain. Recommend ice or heat. Recommend mild stretching as below. Recommended patient to get lumbar x-ray if not improving in 2 days. Strongly recommend patient does not work.  No crawling under houses no climbing ladders no bending over! - methocarbamol (ROBAXIN) 750 MG tablet; Take 1 tablet (750 mg total) by mouth 4 (four) times daily.  Dispense: 120 tablet; Refill: 0 - traMADol-acetaminophen (ULTRACET) 37.5-325 MG tablet; Take 2 tablets by mouth every 6 (six) hours as needed.  Dispense: 48 tablet; Refill: 0 - meloxicam (MOBIC) 15 MG tablet; Take 1 tablet (15 mg total) by mouth daily.  Dispense: 90 tablet; Refill: 0 - DG Lumbar Spine Complete    Meds ordered this encounter  Medications  . methocarbamol (ROBAXIN) 750 MG tablet    Sig: Take 1 tablet (750 mg total) by mouth 4 (four) times daily.    Dispense:  120 tablet    Refill:  0  . traMADol-acetaminophen (ULTRACET) 37.5-325 MG tablet  Sig: Take 2 tablets by mouth every 6 (six) hours as needed.    Dispense:  48 tablet    Refill:  0  . meloxicam (MOBIC) 15 MG tablet    Sig: Take 1 tablet (15 mg total) by mouth daily.    Dispense:  90 tablet    Refill:  0    Orders Placed This Encounter  Procedures  . DG Lumbar Spine Complete     Follow-up: No follow-ups on file.  An After Visit Summary was printed and given to the patient.  Blane Ohara [facility] [phone]

## 2020-09-01 NOTE — Patient Instructions (Signed)
Start on meloxicam 15 mg once daily.  Do not take Advil (ibuprofen) or Aleve (naproxen) Ultracet 1-2 every 6 hours as needed for severe pain Methocarbamol 750 mg 1 every 6 hours as needed for severe muscle pain. Recommend ice or heat. Recommend mild stretching as below. Recommended patient to get lumbar x-ray if not improving in 2 days. Strongly recommend patient does not work.  No crawling under houses no climbing ladders no bending over!   Acute Back Pain, Adult Acute back pain is sudden and usually short-lived. It is often caused by an injury to the muscles and tissues in the back. The injury may result from:  A muscle or ligament getting overstretched or torn (strained). Ligaments are tissues that connect bones to each other. Lifting something improperly can cause a back strain.  Wear and tear (degeneration) of the spinal disks. Spinal disks are circular tissue that provides cushioning between the bones of the spine (vertebrae).  Twisting motions, such as while playing sports or doing yard work.  A hit to the back.  Arthritis. You may have a physical exam, lab tests, and imaging tests to find the cause of your pain. Acute back pain usually goes away with rest and home care. Follow these instructions at home: Managing pain, stiffness, and swelling  Take over-the-counter and prescription medicines only as told by your health care provider.  Your health care provider may recommend applying ice during the first 24-48 hours after your pain starts. To do this: ? Put ice in a plastic bag. ? Place a towel between your skin and the bag. ? Leave the ice on for 20 minutes, 2-3 times a day.  If directed, apply heat to the affected area as often as told by your health care provider. Use the heat source that your health care provider recommends, such as a moist heat pack or a heating pad. ? Place a towel between your skin and the heat source. ? Leave the heat on for 20-30 minutes. ? Remove  the heat if your skin turns bright red. This is especially important if you are unable to feel pain, heat, or cold. You have a greater risk of getting burned. Activity   Do not stay in bed. Staying in bed for more than 1-2 days can delay your recovery.  Sit up and stand up straight. Avoid leaning forward when you sit, or hunching over when you stand. ? If you work at a desk, sit close to it so you do not need to lean over. Keep your chin tucked in. Keep your neck drawn back, and keep your elbows bent at a right angle. Your arms should look like the letter "L." ? Sit high and close to the steering wheel when you drive. Add lower back (lumbar) support to your car seat, if needed.  Take short walks on even surfaces as soon as you are able. Try to increase the length of time you walk each day.  Do not sit, drive, or stand in one place for more than 30 minutes at a time. Sitting or standing for long periods of time can put stress on your back.  Do not drive or use heavy machinery while taking prescription pain medicine.  Use proper lifting techniques. When you bend and lift, use positions that put less stress on your back: ? [place] your knees. ? Keep the load close to your body. ? Avoid twisting.  Exercise regularly as told by your health care provider. Exercising helps your back  heal faster and helps prevent back injuries by keeping muscles strong and flexible.  Work with a physical therapist to make a safe exercise program, as recommended by your health care provider. Do any exercises as told by your physical therapist. Lifestyle  Maintain a healthy weight. Extra weight puts stress on your back and makes it difficult to have good posture.  Avoid activities or situations that make you feel anxious or stressed. Stress and anxiety increase muscle tension and can make back pain worse. Learn ways to manage anxiety and stress, such as through exercise. General instructions  Sleep on a firm  mattress in a comfortable position. Try lying on your side with your knees slightly bent. If you lie on your back, put a pillow under your knees.  Follow your treatment plan as told by your health care provider. This may include: ? Cognitive or behavioral therapy. ? Acupuncture or massage therapy. ? Meditation or yoga. Contact a health care provider if:  You have pain that is not relieved with rest or medicine.  You have increasing pain going down into your legs or buttocks.  Your pain does not improve after 2 weeks.  You have pain at night.  You lose weight without trying.  You have a fever or chills. Get help right away if:  You develop new bowel or bladder control problems.  You have unusual weakness or numbness in your arms or legs.  You develop nausea or vomiting.  You develop abdominal pain.  You feel faint. Summary  Acute back pain is sudden and usually short-lived.  Use proper lifting techniques. When you bend and lift, use positions that put less stress on your back.  Take over-the-counter and prescription medicines and apply heat or ice as directed by your health care provider. This information is not intended to replace advice given to you by your health care provider. Make sure you discuss any questions you have with your health care provider. Document Revised: 02/26/2019 Document Reviewed: 06/21/2017 Elsevier Patient Education  2020 [employer].

## 2020-09-06 ENCOUNTER — Encounter: Payer: Self-pay | Admitting: Family Medicine

## 2020-09-10 ENCOUNTER — Encounter: Payer: Self-pay | Admitting: Family Medicine

## 2020-09-10 ENCOUNTER — Ambulatory Visit (INDEPENDENT_AMBULATORY_CARE_PROVIDER_SITE_OTHER): Payer: PRIVATE HEALTH INSURANCE | Admitting: Family Medicine

## 2020-09-10 ENCOUNTER — Other Ambulatory Visit: Payer: Self-pay

## 2020-09-10 VITALS — BP 130/72 | HR 112 | Temp 97.7°F | Ht 69.0 in | Wt 282.2 lb

## 2020-09-10 DIAGNOSIS — M5441 Lumbago with sciatica, right side: Secondary | ICD-10-CM

## 2020-09-10 DIAGNOSIS — M1711 Unilateral primary osteoarthritis, right knee: Secondary | ICD-10-CM | POA: Diagnosis not present

## 2020-09-10 DIAGNOSIS — M1712 Unilateral primary osteoarthritis, left knee: Secondary | ICD-10-CM | POA: Diagnosis not present

## 2020-09-10 DIAGNOSIS — M170 Bilateral primary osteoarthritis of knee: Secondary | ICD-10-CM

## 2020-09-10 DIAGNOSIS — E1142 Type 2 diabetes mellitus with diabetic polyneuropathy: Secondary | ICD-10-CM

## 2020-09-10 DIAGNOSIS — M5442 Lumbago with sciatica, left side: Secondary | ICD-10-CM

## 2020-09-10 DIAGNOSIS — M5440 Lumbago with sciatica, unspecified side: Secondary | ICD-10-CM | POA: Diagnosis not present

## 2020-09-10 MED ORDER — PREDNISONE 50 MG PO TABS: 50.0000 mg | ORAL_TABLET | Freq: Every day | ORAL | 0 refills | Status: DC

## 2020-09-10 MED ORDER — METFORMIN HCL 1000 MG PO TABS: 1000.0000 mg | ORAL_TABLET | Freq: Two times a day (BID) | ORAL | 3 refills | Status: DC

## 2020-09-10 MED ORDER — TRAMADOL-ACETAMINOPHEN 37.5-325 MG PO TABS: 2.0000 | ORAL_TABLET | Freq: Four times a day (QID) | ORAL | 0 refills | Status: DC | PRN

## 2020-09-10 NOTE — Progress Notes (Signed)
Established Patient Office Visit  Subjective:  Patient ID: Russell Klein, male    DOB: Jan 13, 1961  Age: 59 y.o. MRN: [id number]  CC:  Chief Complaint  Patient presents with  . Hypertension    5W follow up    HPI Russell Klein presents for diabetes. Checking sugar 1-2 times a week, not daily as requested. Sugars 120-134. Taking jardiance 25 mg once daily, janumet 50/1000 mg one twice a day, stopped actos daily, stopped glipizide, and ozempic 0.25 mg weeky. The ozempic is helping him suppress his appetite.  He checks his feet daily.   Lost weight 04/2020 300 lbs and dropped to 282 lbs on 09/10/2020.  Hypertension well controlled on metoprolol xr 25 mg once daily and Lisinopril 20  Mg daily.He is trying to eat healthy.    Hyperlipidemia: currently on lipitor 20 mg once daily. Eating low fat diet.   Severe back pain (lumbar) and radiates into hips/buttocks. Not radiculopathy. Taking methocarbamol 750 mg one qid, and Ultracet 37.5/325 mg two pills four times a day. Improved and was ok for 2 days and then bent over vacuuming and felt a sudden pain in low back. Has an appointment with Back surgeon on November 1st, 2021. Did not get xrays done.    Past Medical History:  Diagnosis Date  . Back pain   . Chronic pain syndrome   . Diabetes (HCC)   . History of stomach ulcers   . Hyperlipidemia   . Hyperparathyroidism (HCC)   . Hypertension   . Primary insomnia   . Pulmonary embolism (HCC)   . Sleep apnea   . Thyroid disease       Family History  Problem Relation Age of Onset  . Stroke Mother   . Diabetes Mother   . Cushing syndrome Father   . Thyroid disease Sister     Social History   Socioeconomic History  . Marital status: Single    Spouse name: Not on file  . Number of children: 2  . Years of education: Not on file  . Highest education level: Not on file  Occupational History    Comment: [employer]  Tobacco Use  . Smoking status: Former Smoker     Types: Cigars    Quit date: 2013    Years since quitting: 8.8  . Smokeless tobacco: Never Used  Vaping Use  . Vaping Use: Never used  Substance and Sexual Activity  . Alcohol use: Not Currently  . Drug use: Never  . Sexual activity: Not on file  Other Topics Concern  . Not on file  Social History Narrative  . Not on file   Social Determinants of Health   Financial Resource Strain:   . Difficulty of Paying Living Expenses: Not on file  Food Insecurity:   . Worried About Programme researcher, broadcasting/film/video in the Last Year: Not on file  . Ran Out of Food in the Last Year: Not on file  Transportation Needs:   . Lack of Transportation (Medical): Not on file  . Lack of Transportation (Non-Medical): Not on file  Physical Activity:   . Days of Exercise per Week: Not on file  . Minutes of Exercise per Session: Not on file  Stress:   . Feeling of Stress : Not on file  Social Connections:   . Frequency of Communication with Friends and Family: Not on file  . Frequency of Social Gatherings with Friends and Family: Not on file  . Attends Religious Services:  Not on file  . Active Member of Clubs or Organizations: Not on file  . Attends BankerClub or Organization Meetings: Not on file  . Marital Status: Not on file  Intimate Partner Violence:   . Fear of Current or Ex-Partner: Not on file  . Emotionally Abused: Not on file  . Physically Abused: Not on file  . Sexually Abused: Not on file    Outpatient Medications Prior to Visit  Medication Sig Dispense Refill  . bupivacaine (MARCAINE) 0.25 % injection Inject into the skin.    Marland Kitchen. triamcinolone acetonide (TRIESENCE) 40 MG/ML SUSP Inject into the articular space.    Marland Kitchen. aspirin EC 81 MG tablet Take 81 mg by mouth daily.    Marland Kitchen. atorvastatin (LIPITOR) 20 MG tablet Take 1 tablet (20 mg total) by mouth daily. 90 tablet 0  . Cholecalciferol (VITAMIN D3) 5000 units TABS Take 1 tablet by mouth daily.    . diclofenac Sodium (VOLTAREN) 1 % GEL Apply 4 grams to R knee  2 to 3 times a day prn pain    . FLUoxetine (PROZAC) 20 MG tablet Take 20 mg by mouth daily.    . fluticasone (FLONASE) 50 MCG/ACT nasal spray Place into the nose.    Marland Kitchen. JANUMET 50-1000 MG tablet TAKE 1 TABLET BY MOUTH TWICE DAILY WITH A MEAL 180 tablet 0  . JARDIANCE 25 MG TABS tablet TAKE 1 TABLET BY MOUTH ONCE DAILY IN THE MORNING 90 tablet 0  . lisinopril (ZESTRIL) 20 MG tablet Take 1 tablet by mouth once daily 90 tablet 1  . loratadine (CLARITIN) 10 MG tablet Take by mouth.    . meloxicam (MOBIC) 15 MG tablet Take 1 tablet (15 mg total) by mouth daily. 90 tablet 0  . methocarbamol (ROBAXIN) 750 MG tablet Take 1 tablet (750 mg total) by mouth 4 (four) times daily. 120 tablet 0  . metoprolol succinate (TOPROL-XL) 25 MG 24 hr tablet Take 25 mg by mouth daily.  0  . nitroGLYCERIN (NITROSTAT) 0.4 MG SL tablet Place 0.4 mg under the tongue every 5 (five) minutes as needed for chest pain.   1  . pantoprazole (PROTONIX) 40 MG tablet Take 1 tablet (40 mg total) by mouth daily. 90 tablet 0  . Semaglutide, 1 MG/DOSE, (OZEMPIC, 1 MG/DOSE,) 2 MG/1.5ML SOPN Inject 1 mg into the skin once a week. 1.5 mL 2  . traMADol-acetaminophen (ULTRACET) 37.5-325 MG tablet Take 2 tablets by mouth every 6 (six) hours as needed. 48 tablet 0  . traZODone (DESYREL) 50 MG tablet Take 1 tablet (50 mg total) by mouth at bedtime. 90 tablet 0   No facility-administered medications prior to visit.    Allergies  Allergen Reactions  . Invokamet [Canagliflozin-Metformin Hcl] Nausea Only    ROS Review of Systems  Constitutional: Negative for fatigue and fever.  HENT: Negative for congestion, ear pain, rhinorrhea and sore throat.   Respiratory: Negative for cough and shortness of breath.   Cardiovascular: Negative for chest pain and leg swelling.  Gastrointestinal: Negative for abdominal pain, constipation, diarrhea, nausea and vomiting.  Endocrine: Negative for polydipsia, polyphagia and polyuria.  Genitourinary: Negative  for dysuria, frequency and urgency.  Musculoskeletal: Positive for arthralgias (BL knee pain) and back pain. Negative for myalgias.  Neurological: Positive for headaches (bad this morning, resolved now. ). Negative for dizziness and weakness.  Psychiatric/Behavioral: Negative for agitation, dysphoric mood and sleep disturbance. The patient is not nervous/anxious.        No anhedonia.  Objective:    Physical Exam Constitutional:      Appearance: Normal appearance. He is well-developed. He is obese.  Neck:     Vascular: No carotid bruit.  Cardiovascular:     Rate and Rhythm: Normal rate and regular rhythm.     Heart sounds: Normal heart sounds.  Pulmonary:     Effort: Pulmonary effort is normal.     Breath sounds: Normal breath sounds.  Abdominal:     General: Abdomen is flat. Bowel sounds are normal.     Palpations: Abdomen is soft.     Tenderness: There is no abdominal tenderness.  Musculoskeletal:        General: Tenderness (BL lumbar and midline. questionable sciatica BL. ) present.  Neurological:     Mental Status: He is alert and oriented to person, place, and time.  Psychiatric:        Mood and Affect: Mood normal.        Behavior: Behavior normal.    Diabetic Foot Exam - Simple   No data filed       BP 130/72 (BP Location: Right Arm, Patient Position: Sitting, Cuff Size: Large)   Pulse (!) 112   Temp 97.7 F (36.5 C) (Temporal)   Ht 5\' 9"  (1.753 m)   Wt 282 lb 3.2 oz (128 kg)   SpO2 99%   BMI 41.67 kg/m  Wt Readings from Last 3 Encounters:  09/10/20 282 lb 3.2 oz (128 kg)  09/01/20 283 lb 3.2 oz (128.5 kg)  08/06/20 288 lb (130.6 kg)     Health Maintenance Due  Topic Date Due  . Hepatitis C Screening  Never done  . PNEUMOCOCCAL POLYSACCHARIDE VACCINE AGE 21-64 HIGH RISK  Never done  . OPHTHALMOLOGY EXAM  Never done  . HIV Screening  Never done  . COLONOSCOPY  Never done    There are no preventive care reminders to display for this  patient.  Lab Results  Component Value Date   TSH 3.120 01/31/2020   Lab Results  Component Value Date   WBC 7.9 08/06/2020   HGB 14.1 08/06/2020   HCT 43.5 08/06/2020   MCV 90 08/06/2020   PLT 288 08/06/2020   Lab Results  Component Value Date   NA 142 08/06/2020   K 4.8 08/06/2020   CO2 22 08/06/2020   GLUCOSE 131 (H) 08/06/2020   BUN 17 08/06/2020   CREATININE 0.96 08/06/2020   BILITOT 0.3 08/06/2020   ALKPHOS 39 (L) 08/06/2020   AST 16 08/06/2020   ALT 32 08/06/2020   PROT 6.8 08/06/2020   ALBUMIN 4.2 08/06/2020   CALCIUM 10.3 (H) 08/06/2020   Lab Results  Component Value Date   CHOL 148 08/06/2020   Lab Results  Component Value Date   HDL 51 08/06/2020   Lab Results  Component Value Date   LDLCALC 79 08/06/2020   Lab Results  Component Value Date   TRIG 95 08/06/2020   Lab Results  Component Value Date   CHOLHDL 2.9 08/06/2020   Lab Results  Component Value Date   HGBA1C 6.8 (H) 08/06/2020      Assessment & Plan:  1. Low back pain/sciatica - concerning for spinal stenosis Stop meloxicam. Distant history of stomach ulcers 30 years ago. Currently on pantoprazole.  Start prednisone 50 mg once daily x 5 days.  Continue methocarbamol (robaxin) Refill of ultracet given.  Keep appointment with back doctor.  2. Diabetic polyneuropathy associated with type 2 diabetes mellitus (HCC) Improving. Recommend  continue to work on eating healthy diet and exercise. Continue Ozempic 1 mg once weekly  Continue Jardiance. Stop janumet. Start metformin 1000 mg one twice a day.   3. Primary osteoarthritis of right knee Work on weight loss.   4. Primary osteoarthritis of left knee Work on weight loss.  Blane Ohara, MD

## 2020-09-10 NOTE — Patient Instructions (Addendum)
Diabetes:  Continue Ozempic 1 mg once weekly  Continue Jardiance. Stop janumet. Start metformin 1000 mg one twice a day.    Low back pain/sciatica Stop meloxicam.  Start prednisone 50 mg once daily x 5 days.  Continue methocarbamol (robaxin) Refill of ultracet given.  Keep appointment with back doctor.

## 2020-09-21 DIAGNOSIS — M5136 Other intervertebral disc degeneration, lumbar region: Secondary | ICD-10-CM | POA: Insufficient documentation

## 2020-10-04 ENCOUNTER — Other Ambulatory Visit: Payer: Self-pay | Admitting: Family Medicine

## 2020-10-12 ENCOUNTER — Other Ambulatory Visit: Payer: Self-pay | Admitting: Family Medicine

## 2020-10-22 DIAGNOSIS — M47816 Spondylosis without myelopathy or radiculopathy, lumbar region: Secondary | ICD-10-CM | POA: Insufficient documentation

## 2020-10-28 ENCOUNTER — Other Ambulatory Visit: Payer: Self-pay | Admitting: Family Medicine

## 2020-10-28 DIAGNOSIS — E1142 Type 2 diabetes mellitus with diabetic polyneuropathy: Secondary | ICD-10-CM

## 2020-11-06 NOTE — Progress Notes (Signed)
Subjective:  Patient ID: Russell Klein, male    DOB: 1961/04/21  Age: 59 y.o. MRN: [id number]  Chief Complaint  Patient presents with  . Diabetes  . Hyperlipidemia  . Hypertension    HPI Hyperlipidemia: Patient is taking Lipitor. Low fat diet. Unable to exercise due to chronic knee pain.  Depression: Patient is taking: Prozac. Well controlled.  Diabetes complicated by hyperlipidemia/polyneuropathy - Patient cheks his blood sugar daily . Sugar ranges are 100 to 158 mg/dl. He checks his foot every day. He takes:ozempic, actos, Jardiance, Metformin. Hypertension: Patient is not checking his blood pressure at home. Patient is taking: Lisinopril, Metoprolol. Chronic back pain - Pt is seeing [facility] and then referred to pain clinic. Looking at doing several injections for his back pain.    Current Outpatient Medications on File Prior to Visit  Medication Sig Dispense Refill  . aspirin EC 81 MG tablet Take 81 mg by mouth daily.    Marland Kitchen atorvastatin (LIPITOR) 20 MG tablet Take 1 tablet (20 mg total) by mouth daily. 90 tablet 0  . bupivacaine (MARCAINE) 0.25 % injection Inject into the skin.    . Cholecalciferol (VITAMIN D3) 5000 units TABS Take 1 tablet by mouth daily.    . diclofenac Sodium (VOLTAREN) 1 % GEL Apply 4 grams to R knee 2 to 3 times a day prn pain    . FLUoxetine (PROZAC) 20 MG tablet Take 20 mg by mouth daily.    . fluticasone (FLONASE) 50 MCG/ACT nasal spray Place into the nose.    Marland Kitchen JARDIANCE 25 MG TABS tablet TAKE 1 TABLET BY MOUTH ONCE DAILY IN THE MORNING 90 tablet 0  . lisinopril (ZESTRIL) 20 MG tablet Take 1 tablet by mouth once daily 90 tablet 1  . loratadine (CLARITIN) 10 MG tablet Take by mouth.    . meloxicam (MOBIC) 15 MG tablet Take by mouth.    . metFORMIN (GLUCOPHAGE) 1000 MG tablet Take 1 tablet (1,000 mg total) by mouth 2 (two) times daily with a meal. 180 tablet 3  . methocarbamol (ROBAXIN) 750 MG tablet Take 1 tablet (750 mg total) by mouth 4 (four) times  daily. 120 tablet 0  . metoprolol succinate (TOPROL-XL) 25 MG 24 hr tablet Take 25 mg by mouth daily.  0  . nitroGLYCERIN (NITROSTAT) 0.4 MG SL tablet Place 0.4 mg under the tongue every 5 (five) minutes as needed for chest pain.   1  . OZEMPIC, 1 MG/DOSE, 4 MG/3ML SOPN INJECT 1MG  INTO THE SKIN ONCE A WEEK 3 mL 2  . pantoprazole (PROTONIX) 40 MG tablet Take 1 tablet by mouth once daily 90 tablet 0  . pioglitazone (ACTOS) 15 MG tablet Take 15 mg by mouth daily.    . traMADol-acetaminophen (ULTRACET) 37.5-325 MG tablet Take 2 tablets by mouth every 6 (six) hours as needed. 48 tablet 0  . traZODone (DESYREL) 50 MG tablet Take 1 tablet (50 mg total) by mouth at bedtime. 90 tablet 0  . triamcinolone acetonide (TRIESENCE) 40 MG/ML SUSP Inject into the articular space.     No current facility-administered medications on file prior to visit.   Past Medical History:  Diagnosis Date  . Back pain   . Chronic pain syndrome   . Diabetes (HCC)   . History of stomach ulcers   . Hyperlipidemia   . Hyperparathyroidism (HCC)   . Hypertension   . Primary insomnia   . Pulmonary embolism (HCC)   . Sleep apnea   . Thyroid disease  Past Surgical History:  Procedure Laterality Date  . APPENDECTOMY    . Exploratory Laporatomy    . LEFT HEART CATH AND CORONARY ANGIOGRAPHY N/A 09/18/2018   Procedure: LEFT HEART CATH AND CORONARY ANGIOGRAPHY;  Surgeon: Marykay Lex, MD;  Location: [facility] INVASIVE CV LAB;  Service: Cardiovascular;  Laterality: N/A;  . PARATHYROIDECTOMY      Family History  Problem Relation Age of Onset  . Stroke Mother   . Diabetes Mother   . Cushing syndrome Father   . Thyroid disease Sister    Social History   Socioeconomic History  . Marital status: Single    Spouse name: Not on file  . Number of children: 2  . Years of education: Not on file  . Highest education level: Not on file  Occupational History    Comment: [employer]  Tobacco Use  . Smoking status:  Former Smoker    Types: Cigars    Quit date: 2013    Years since quitting: 8.9  . Smokeless tobacco: Never Used  Vaping Use  . Vaping Use: Never used  Substance and Sexual Activity  . Alcohol use: Not Currently  . Drug use: Never  . Sexual activity: Yes    Partners: Female  Other Topics Concern  . Not on file  Social History Narrative  . Not on file   Social Determinants of Health   Financial Resource Strain: Not on file  Food Insecurity: Not on file  Transportation Needs: Not on file  Physical Activity: Not on file  Stress: Not on file  Social Connections: Not on file    Review of Systems  Constitutional: Negative for chills, fatigue, fever and unexpected weight change.  HENT: Negative for congestion, ear pain and sore throat.   Respiratory: Negative for cough and shortness of breath.   Cardiovascular: Negative for chest pain and leg swelling.  Gastrointestinal: Positive for constipation and nausea. Negative for abdominal pain, blood in stool, diarrhea and vomiting.  Endocrine: Positive for polyphagia. Negative for polydipsia.  Genitourinary: Negative for dysuria, frequency, hematuria and urgency.  Musculoskeletal: Positive for arthralgias and back pain. Negative for myalgias.  Skin: Negative for color change and rash.  Neurological: Negative for dizziness and headaches.  Psychiatric/Behavioral: Negative for dysphoric mood.     Objective:  BP 118/70   Pulse 99   Temp (!) 97.4 F (36.3 C)   Ht 5\' 9"  (1.753 m)   Wt 281 lb (127.5 kg)   SpO2 96%   BMI 41.50 kg/m   BP/Weight 11/09/2020 09/10/2020 09/01/2020  Systolic BP 118 130 130  Diastolic BP 70 72 72  Wt. (Lbs) 281 282.2 283.2  BMI 41.5 41.67 41.82    Physical Exam Vitals reviewed.  Constitutional:      Appearance: Normal appearance. He is normal weight.  Cardiovascular:     Rate and Rhythm: Normal rate and regular rhythm.  Pulmonary:     Effort: Pulmonary effort is normal.     Breath sounds: Normal  breath sounds.  Abdominal:     General: Abdomen is flat. Bowel sounds are normal.     Palpations: Abdomen is soft.  Neurological:     Mental Status: He is alert and oriented to person, place, and time.  Psychiatric:        Mood and Affect: Mood normal.        Behavior: Behavior normal.     Diabetic Foot Exam - Simple   Simple Foot Form Diabetic Foot exam was performed  with the following findings: Yes 11/09/2020  9:40 PM  Visual Inspection No deformities, no ulcerations, no other skin breakdown bilaterally: Yes Sensation Testing Intact to touch and monofilament testing bilaterally: Yes Pulse Check Posterior Tibialis and Dorsalis pulse intact bilaterally: Yes Comments      Lab Results  Component Value Date   WBC 7.9 08/06/2020   HGB 14.1 08/06/2020   HCT 43.5 08/06/2020   PLT 288 08/06/2020   GLUCOSE 131 (H) 08/06/2020   CHOL 148 08/06/2020   TRIG 95 08/06/2020   HDL 51 08/06/2020   LDLCALC 79 08/06/2020   ALT 32 08/06/2020   AST 16 08/06/2020   NA 142 08/06/2020   K 4.8 08/06/2020   CL 105 08/06/2020   CREATININE 0.96 08/06/2020   BUN 17 08/06/2020   CO2 22 08/06/2020   TSH 3.120 01/31/2020   PSA 0.6 12/03/2019   HGBA1C 6.8 (H) 08/06/2020   MICROALBUR 30 11/09/2020      Assessment & Plan:   1. Essential hypertension Well controlled.  No changes to medicines.  Continue to work on eating a healthy diet and exercise.  Labs drawn today.  - Comprehensive metabolic panel  2. Mixed hyperlipidemia Well controlled.  No changes to medicines.  Continue to work on eating a healthy diet and exercise.  Labs drawn today.  - Lipid panel  3. Diabetic polyneuropathy associated with type 2 diabetes mellitus (HCC) Control: good Recommend check sugars fasting daily. Recommend check feet daily. Recommend annual eye exams. Medicines: no changes. Continue to work on eating a healthy diet and exercise.  Labs drawn today.   - CBC with Differential/Platelet -  Hemoglobin A1c - POCT UA - Microalbumin  4. Chronic bilateral low back pain without sciatica Management per specialist. Pain clinic.  5. Primary osteoarthritis of both knees  Management per specialist. Orthopedics.  Orders Placed This Encounter  Procedures  . CBC with Differential/Platelet  . Comprehensive metabolic panel  . Hemoglobin A1c  . Lipid panel  . PSA  . POCT UA - Microalbumin     Follow-up: Return in about 3 months (around 02/07/2021).  An After Visit Summary was printed and given to the patient.  Blane Ohara, MD [facility] [phone]

## 2020-11-09 ENCOUNTER — Encounter: Payer: Self-pay | Admitting: Family Medicine

## 2020-11-09 ENCOUNTER — Other Ambulatory Visit: Payer: Self-pay

## 2020-11-09 ENCOUNTER — Ambulatory Visit (INDEPENDENT_AMBULATORY_CARE_PROVIDER_SITE_OTHER): Payer: PRIVATE HEALTH INSURANCE | Admitting: Family Medicine

## 2020-11-09 VITALS — BP 118/70 | HR 99 | Temp 97.4°F | Ht 69.0 in | Wt 281.0 lb

## 2020-11-09 DIAGNOSIS — I10 Essential (primary) hypertension: Secondary | ICD-10-CM

## 2020-11-09 DIAGNOSIS — E1142 Type 2 diabetes mellitus with diabetic polyneuropathy: Secondary | ICD-10-CM

## 2020-11-09 DIAGNOSIS — E782 Mixed hyperlipidemia: Secondary | ICD-10-CM | POA: Diagnosis not present

## 2020-11-09 DIAGNOSIS — G8929 Other chronic pain: Secondary | ICD-10-CM

## 2020-11-09 DIAGNOSIS — M170 Bilateral primary osteoarthritis of knee: Secondary | ICD-10-CM

## 2020-11-09 DIAGNOSIS — M545 Low back pain, unspecified: Secondary | ICD-10-CM | POA: Diagnosis not present

## 2020-11-09 LAB — POCT UA - MICROALBUMIN: Microalbumin Ur, POC: 30 mg/L

## 2020-11-10 LAB — COMPREHENSIVE METABOLIC PANEL
ALT: 33 IU/L (ref 0–44)
AST: 18 IU/L (ref 0–40)
Albumin/Globulin Ratio: 1.8 (ref 1.2–2.2)
Albumin: 4.4 g/dL (ref 3.8–4.9)
Alkaline Phosphatase: 44 IU/L (ref 44–121)
BUN/Creatinine Ratio: 23 — ABNORMAL HIGH (ref 9–20)
BUN: 22 mg/dL (ref 6–24)
Bilirubin Total: 0.3 mg/dL (ref 0.0–1.2)
CO2: 22 mmol/L (ref 20–29)
Calcium: 10.8 mg/dL — ABNORMAL HIGH (ref 8.7–10.2)
Chloride: 102 mmol/L (ref 96–106)
Creatinine, Ser: 0.94 mg/dL (ref 0.76–1.27)
GFR calc Af Amer: 103 mL/min/{1.73_m2} (ref >59)
GFR calc non Af Amer: 89 mL/min/{1.73_m2} (ref >59)
Globulin, Total: 2.5 g/dL (ref 1.5–4.5)
Glucose: 118 mg/dL — ABNORMAL HIGH (ref 65–99)
Potassium: 5.1 mmol/L (ref 3.5–5.2)
Sodium: 142 mmol/L (ref 134–144)
Total Protein: 6.9 g/dL (ref 6.0–8.5)

## 2020-11-10 LAB — CBC WITH DIFFERENTIAL/PLATELET
Basophils Absolute: 0 10*3/uL (ref 0.0–0.2)
Basos: 1 %
EOS (ABSOLUTE): 0.2 10*3/uL (ref 0.0–0.4)
Eos: 2 %
Hematocrit: 44.4 % (ref 37.5–51.0)
Hemoglobin: 14.7 g/dL (ref 13.0–17.7)
Immature Grans (Abs): 0.1 10*3/uL (ref 0.0–0.1)
Immature Granulocytes: 1 %
Lymphocytes Absolute: 1.4 10*3/uL (ref 0.7–3.1)
Lymphs: 17 %
MCH: 29.5 pg (ref 26.6–33.0)
MCHC: 33.1 g/dL (ref 31.5–35.7)
MCV: 89 fL (ref 79–97)
Monocytes Absolute: 0.7 10*3/uL (ref 0.1–0.9)
Monocytes: 8 %
Neutrophils Absolute: 5.9 10*3/uL (ref 1.4–7.0)
Neutrophils: 71 %
Platelets: 296 10*3/uL (ref 150–450)
RBC: 4.99 x10E6/uL (ref 4.14–5.80)
RDW: 15 % (ref 11.6–15.4)
WBC: 8.1 10*3/uL (ref 3.4–10.8)

## 2020-11-10 LAB — HEMOGLOBIN A1C
Est. average glucose Bld gHb Est-mCnc: 151 mg/dL
Hgb A1c MFr Bld: 6.9 % — ABNORMAL HIGH (ref 4.8–5.6)

## 2020-11-10 LAB — LIPID PANEL
Chol/HDL Ratio: 2.8 ratio (ref 0.0–5.0)
Cholesterol, Total: 147 mg/dL (ref 100–199)
HDL: 53 mg/dL (ref >39)
LDL Chol Calc (NIH): 77 mg/dL (ref 0–99)
Triglycerides: 88 mg/dL (ref 0–149)
VLDL Cholesterol Cal: 17 mg/dL (ref 5–40)

## 2020-11-10 LAB — CARDIOVASCULAR RISK ASSESSMENT

## 2020-12-10 ENCOUNTER — Other Ambulatory Visit: Payer: Self-pay | Admitting: Family Medicine

## 2020-12-10 MED ORDER — ATORVASTATIN CALCIUM 20 MG PO TABS: 20.0000 mg | ORAL_TABLET | Freq: Every day | ORAL | 0 refills | Status: DC

## 2020-12-16 ENCOUNTER — Telehealth: Payer: Self-pay

## 2020-12-16 NOTE — Telephone Encounter (Signed)
PA submitted and approved for Lipitor via [email or web address]

## 2020-12-18 ENCOUNTER — Other Ambulatory Visit: Payer: Self-pay | Admitting: Family Medicine

## 2021-01-04 ENCOUNTER — Other Ambulatory Visit: Payer: Self-pay

## 2021-01-05 NOTE — Telephone Encounter (Signed)
Please call patient and see if taking meloxicam.  I thought he was on diclofenac.

## 2021-01-06 NOTE — Telephone Encounter (Signed)
Left message for patient to call back confirming which medication he is taking.

## 2021-01-10 ENCOUNTER — Other Ambulatory Visit: Payer: Self-pay | Admitting: Family Medicine

## 2021-01-11 MED ORDER — MELOXICAM 15 MG PO TABS: 15.0000 mg | ORAL_TABLET | Freq: Every day | ORAL | 2 refills | Status: DC

## 2021-01-11 NOTE — Telephone Encounter (Signed)
Called pt. Pt states bottle from January 5th says meloxicam 15 mg.   Lorita Officer, [place] 01/11/21 10:41 AM

## 2021-01-13 ENCOUNTER — Telehealth: Payer: Self-pay

## 2021-01-13 NOTE — Telephone Encounter (Signed)
Pt's wife called in an is asking for generic of Jardiance since pt has [place] and they only want to help pay for the generics. She said pt is completely out of this medication, she went to pick up the Jardiance brand name and it was $500.

## 2021-01-14 ENCOUNTER — Ambulatory Visit: Payer: Self-pay

## 2021-01-14 ENCOUNTER — Other Ambulatory Visit: Payer: Self-pay | Admitting: Family Medicine

## 2021-01-14 MED ORDER — DAPAGLIFLOZIN PROPANEDIOL 5 MG PO TABS: 5.0000 mg | ORAL_TABLET | Freq: Every day | ORAL | 2 refills | Status: DC

## 2021-01-14 NOTE — Telephone Encounter (Signed)
Changed jardiance to farxiga.  Hopefully coverage may be better.  Also please ask them if he has to meet a deductbile first. If so, any medicine may be expensive. Kc

## 2021-01-14 NOTE — Telephone Encounter (Signed)
Discussed with patient's wife. She is unaware of any deductible with his meds.  She will have him pick-up the farxiga.

## 2021-01-14 NOTE — Telephone Encounter (Signed)
Left a message for patient that [place] Pepper is not available in generic.  Dr. Sedalia Muta advised that he call his [employer] about formulary preference.

## 2021-01-14 NOTE — Progress Notes (Signed)
Changed jardiance to farxiga.  Hopefully coverage may be better.  Also please ask them if he has to meet a deductbile first. If so, any medicine may be expensive. Kc  

## 2021-01-23 ENCOUNTER — Other Ambulatory Visit: Payer: Self-pay | Admitting: Family Medicine

## 2021-01-23 DIAGNOSIS — E1142 Type 2 diabetes mellitus with diabetic polyneuropathy: Secondary | ICD-10-CM

## 2021-02-04 NOTE — Progress Notes (Signed)
Subjective:  Patient ID: Russell Klein, male    DOB: [birth year]  Age: 60 y.o. MRN: [id number]  Chief Complaint  Patient presents with  . Diabetes  . Hypertension    HPI Essential hypertension Takes lisinopril and metoprolol  Mixed hyperlipidemia Taking lipitor daily. Tries to eat healthy, Very active.   Diabetic polyneuropathy associated with type 2 diabetes mellitus (HCC) Controlled with farxiga 5 mg daily, ozempic 1 mg once weekly, actos 15 mg daily and metformin 1000 mg twice a day. Sugars 1-2 times per week. 120s.   Chronic bilateral low back pain without sciatica Robaxin and tramadol help.  Knee OA: Severe pain. Unable to get surgery due to weight. Had steroid injection in 12/2020..    Current Outpatient Medications on File Prior to Visit  Medication Sig Dispense Refill  . aspirin EC 81 MG tablet Take 81 mg by mouth daily.    Marland Kitchen atorvastatin (LIPITOR) 20 MG tablet Take 1 tablet (20 mg total) by mouth daily. 90 tablet 0  . bupivacaine (MARCAINE) 0.25 % injection Inject into the skin.    . Cholecalciferol (VITAMIN D3) 5000 units TABS Take 1 tablet by mouth daily.    . dapagliflozin propanediol (FARXIGA) 5 MG TABS tablet Take 1 tablet (5 mg total) by mouth daily before breakfast. 30 tablet 2  . diclofenac Sodium (VOLTAREN) 1 % GEL Apply 4 grams to R knee 2 to 3 times a day prn pain    . FLUoxetine (PROZAC) 20 MG tablet Take 20 mg by mouth daily.    . fluticasone (FLONASE) 50 MCG/ACT nasal spray Place into the nose.    Marland Kitchen lisinopril (ZESTRIL) 20 MG tablet Take 1 tablet by mouth once daily 90 tablet 1  . loratadine (CLARITIN) 10 MG tablet Take by mouth.    . meloxicam (MOBIC) 15 MG tablet Take 1 tablet (15 mg total) by mouth daily. 30 tablet 2  . metFORMIN (GLUCOPHAGE) 1000 MG tablet Take 1 tablet (1,000 mg total) by mouth 2 (two) times daily with a meal. 180 tablet 3  . methocarbamol (ROBAXIN) 750 MG tablet Take 1 tablet (750 mg total) by mouth 4 (four) times daily. 120 tablet 0   . metoprolol succinate (TOPROL-XL) 25 MG 24 hr tablet Take 25 mg by mouth daily.  0  . nitroGLYCERIN (NITROSTAT) 0.4 MG SL tablet Place 0.4 mg under the tongue every 5 (five) minutes as needed for chest pain.   1  . OZEMPIC, 1 MG/DOSE, 4 MG/3ML SOPN INJECT 1MG  INTO THE SKIN ONCE A WEEK 3 mL 0  . pantoprazole (PROTONIX) 40 MG tablet Take 1 tablet by mouth once daily 90 tablet 0  . pioglitazone (ACTOS) 15 MG tablet Take 1 tablet by mouth once daily 90 tablet 0  . traMADol-acetaminophen (ULTRACET) 37.5-325 MG tablet Take 2 tablets by mouth every 6 (six) hours as needed. 48 tablet 0  . traZODone (DESYREL) 50 MG tablet Take 1 tablet (50 mg total) by mouth at bedtime. 90 tablet 0  . triamcinolone acetonide (TRIESENCE) 40 MG/ML SUSP Inject into the articular space.     No current facility-administered medications on file prior to visit.   Past Medical History:  Diagnosis Date  . Back pain   . Chronic pain syndrome   . Diabetes (HCC)   . History of stomach ulcers   . Hyperlipidemia   . Hyperparathyroidism (HCC)   . Hypertension   . Primary insomnia   . Pulmonary embolism (HCC)   . Sleep apnea   .  Thyroid disease    Past Surgical History:  Procedure Laterality Date  . APPENDECTOMY    . Exploratory Laporatomy    . LEFT HEART CATH AND CORONARY ANGIOGRAPHY N/A 09/18/2018   Procedure: LEFT HEART CATH AND CORONARY ANGIOGRAPHY;  Surgeon: Marykay Lex, MD;  Location: [facility] INVASIVE CV LAB;  Service: Cardiovascular;  Laterality: N/A;  . PARATHYROIDECTOMY      Family History  Problem Relation Age of Onset  . Stroke Mother   . Diabetes Mother   . Cushing syndrome Father   . Thyroid disease Sister    Social History   Socioeconomic History  . Marital status: Single    Spouse name: Not on file  . Number of children: 2  . Years of education: Not on file  . Highest education level: Not on file  Occupational History    Comment: [employer]  Tobacco Use  . Smoking status:  Former Smoker    Types: Cigars    Quit date: 2013    Years since quitting: 9.2  . Smokeless tobacco: Never Used  Vaping Use  . Vaping Use: Never used  Substance and Sexual Activity  . Alcohol use: Not Currently  . Drug use: Never  . Sexual activity: Yes    Partners: Female  Other Topics Concern  . Not on file  Social History Narrative  . Not on file   Social Determinants of Health   Financial Resource Strain: Not on file  Food Insecurity: Not on file  Transportation Needs: Not on file  Physical Activity: Not on file  Stress: Not on file  Social Connections: Not on file    Review of Systems  Constitutional: Negative for chills, diaphoresis, fatigue and fever.  HENT: Positive for congestion (using sinus rinse, flonase, saline spray, allegra. Not helping. ) and postnasal drip. Negative for ear pain and sore throat.   Respiratory: Negative for cough and shortness of breath.   Cardiovascular: Negative for chest pain and leg swelling.  Gastrointestinal: Positive for nausea (from PND). Negative for abdominal pain, constipation, diarrhea and vomiting.  Endocrine: Positive for polyphagia. Negative for polydipsia and polyuria.  Genitourinary: Negative for dysuria and urgency.  Musculoskeletal: Positive for arthralgias (bilateral knee pain but much improved since steroid injection ) and back pain. Negative for myalgias.  Neurological: Positive for headaches (sinus). Negative for dizziness.  Psychiatric/Behavioral: Positive for agitation. Negative for dysphoric mood. The patient is not nervous/anxious.      Objective:  BP 128/76   Pulse (!) 104   Temp 97.6 F (36.4 C)   Resp 20   Ht 5\' 9"  (1.753 m)   Wt 280 lb 6.4 oz (127.2 kg)   BMI 41.41 kg/m   BP/Weight 02/08/2021 11/09/2020 09/10/2020  Systolic BP 128 118 130  Diastolic BP 76 70 72  Wt. (Lbs) 280.4 281 282.2  BMI 41.41 41.5 41.67    Physical Exam Vitals reviewed.  Constitutional:      Appearance: Normal  appearance. He is normal weight.  HENT:     Right Ear: Tympanic membrane normal.     Left Ear: Tympanic membrane normal.     Nose: Congestion present.     Mouth/Throat:     Mouth: Mucous membranes are dry.     Pharynx: No oropharyngeal exudate or posterior oropharyngeal erythema.  Neck:     Vascular: No carotid bruit.  Cardiovascular:     Rate and Rhythm: Normal rate and regular rhythm.     Heart sounds: Normal heart sounds.  No murmur heard.   Pulmonary:     Effort: Pulmonary effort is normal.     Breath sounds: Normal breath sounds.  Abdominal:     General: Abdomen is flat. Bowel sounds are normal.     Palpations: Abdomen is soft.     Tenderness: There is no abdominal tenderness.  Neurological:     Mental Status: He is alert and oriented to person, place, and time.  Psychiatric:        Mood and Affect: Mood normal.        Behavior: Behavior normal.     Diabetic Foot Exam - Simple   Simple Foot Form Diabetic Foot exam was performed with the following findings: Yes 02/08/2021  9:41 AM  Visual Inspection No deformities, no ulcerations, no other skin breakdown bilaterally: Yes Sensation Testing Intact to touch and monofilament testing bilaterally: Yes Pulse Check Posterior Tibialis and Dorsalis pulse intact bilaterally: Yes Comments      Lab Results  Component Value Date   WBC 8.1 11/09/2020   HGB 14.7 11/09/2020   HCT 44.4 11/09/2020   PLT 296 11/09/2020   GLUCOSE 118 (H) 11/09/2020   CHOL 147 11/09/2020   TRIG 88 11/09/2020   HDL 53 11/09/2020   LDLCALC 77 11/09/2020   ALT 33 11/09/2020   AST 18 11/09/2020   NA 142 11/09/2020   K 5.1 11/09/2020   CL 102 11/09/2020   CREATININE 0.94 11/09/2020   BUN 22 11/09/2020   CO2 22 11/09/2020   TSH 3.120 01/31/2020   PSA 0.6 12/03/2019   HGBA1C 6.9 (H) 11/09/2020   MICROALBUR 30 11/09/2020      Assessment & Plan:   1. Essential hypertension Well controlled.  No changes to medicines.  Continue to work on  eating a healthy diet and exercise.  Labs drawn today.  - Comprehensive metabolic panel - TSH  2. Mixed hyperlipidemia Well controlled.  No changes to medicines.  Continue to work on eating a healthy diet and exercise.  Labs drawn today.  - Lipid panel  3. Diabetic polyneuropathy associated with type 2 diabetes mellitus (HCC) Control: unsure Recommend check sugars fasting daily. Recommend check feet daily. Recommend annual eye exams. Medicines: stop actos due to increased appetite. Increase farxiga to 10 mg daily. Continue metformin and ozempic.  Continue to work on eating a healthy diet and exercise.  Labs drawn today.   - CBC with Differential/Platelet - Hemoglobin A1c  4. Chronic bilateral low back pain without sciatica On tramadol.  5. Acute non-recurrent sinusitis of other sinus - azithromycin (ZITHROMAX) 250 MG tablet; 2 pills daily x 3 days  Dispense: 6 each; Refill: 0   6. OA BLKNEES: needs weight loss.  Continue meloxicam.  7. Morbid obesity. BMI 41.  Diabetic diet given.  Meds ordered this encounter  Medications  . azithromycin (ZITHROMAX) 250 MG tablet    Sig: 2 pills daily x 3 days    Dispense:  6 each    Refill:  0    Orders Placed This Encounter  Procedures  . CBC with Differential/Platelet  . Comprehensive metabolic panel  . Hemoglobin A1c  . Lipid panel  . TSH     Follow-up: Return in about 3 months (around 05/11/2021) for FASTING.  An After Visit Summary was printed and given to the patient.  Blane Ohara, MD [facility] [phone]

## 2021-02-08 ENCOUNTER — Encounter: Payer: Self-pay | Admitting: Family Medicine

## 2021-02-08 ENCOUNTER — Ambulatory Visit (INDEPENDENT_AMBULATORY_CARE_PROVIDER_SITE_OTHER): Payer: 59 | Admitting: Family Medicine

## 2021-02-08 ENCOUNTER — Other Ambulatory Visit: Payer: Self-pay

## 2021-02-08 ENCOUNTER — Other Ambulatory Visit: Payer: Self-pay | Admitting: Family Medicine

## 2021-02-08 VITALS — BP 128/76 | HR 104 | Temp 97.6°F | Resp 20 | Ht 69.0 in | Wt 280.4 lb

## 2021-02-08 DIAGNOSIS — E782 Mixed hyperlipidemia: Secondary | ICD-10-CM | POA: Diagnosis not present

## 2021-02-08 DIAGNOSIS — I10 Essential (primary) hypertension: Secondary | ICD-10-CM | POA: Diagnosis not present

## 2021-02-08 DIAGNOSIS — M170 Bilateral primary osteoarthritis of knee: Secondary | ICD-10-CM

## 2021-02-08 DIAGNOSIS — E1142 Type 2 diabetes mellitus with diabetic polyneuropathy: Secondary | ICD-10-CM

## 2021-02-08 DIAGNOSIS — M545 Low back pain, unspecified: Secondary | ICD-10-CM

## 2021-02-08 DIAGNOSIS — G8929 Other chronic pain: Secondary | ICD-10-CM

## 2021-02-08 DIAGNOSIS — J018 Other acute sinusitis: Secondary | ICD-10-CM

## 2021-02-08 MED ORDER — AZITHROMYCIN 250 MG PO TABS: ORAL_TABLET | ORAL | 0 refills | Status: DC

## 2021-02-08 MED ORDER — DAPAGLIFLOZIN PROPANEDIOL 10 MG PO TABS: 10.0000 mg | ORAL_TABLET | Freq: Every day | ORAL | 0 refills | Status: DC

## 2021-02-08 NOTE — Patient Instructions (Signed)
STOP ACTOS (PIOGLITAZONE) INCREASE FARXIGA TO 10 MG ONCE DAILY (MAY DOUBLE UP ON 5 MG TABLETS UNTIL GONE. ) CONTINUE OTHER MEDICINES AS PRESCRIBED.    Diabetes Mellitus and Nutrition, Adult When you have diabetes, or diabetes mellitus, it is very important to have healthy eating habits because your blood sugar (glucose) levels are greatly affected by what you eat and drink. Eating healthy foods in the right amounts, at about the same times every day, can help you:  Control your blood glucose.  Lower your risk of heart disease.  Improve your blood pressure.  Reach or maintain a healthy weight. What can affect my meal plan? Every person with diabetes is different, and each person has different needs for a meal plan. Your health care provider may recommend that you work with a dietitian to make a meal plan that is best for you. Your meal plan may vary depending on factors such as:  The calories you need.  The medicines you take.  Your weight.  Your blood glucose, blood pressure, and cholesterol levels.  Your activity level.  Other health conditions you have, such as heart or kidney disease. How do carbohydrates affect me? Carbohydrates, also called carbs, affect your blood glucose level more than any other type of food. Eating carbs naturally raises the amount of glucose in your blood. Carb counting is a method for keeping track of how many carbs you eat. Counting carbs is important to keep your blood glucose at a healthy level, especially if you use insulin or take certain oral diabetes medicines. It is important to know how many carbs you can safely have in each meal. This is different for every person. Your dietitian can help you calculate how many carbs you should have at each meal and for each snack. How does alcohol affect me? Alcohol can cause a sudden decrease in blood glucose (hypoglycemia), especially if you use insulin or take certain oral diabetes medicines. Hypoglycemia can  be a life-threatening condition. Symptoms of hypoglycemia, such as sleepiness, dizziness, and confusion, are similar to symptoms of having too much alcohol.  Do not drink alcohol if: ? Your health care provider tells you not to drink. ? You are pregnant, may be pregnant, or are planning to become pregnant.  If you drink alcohol: ? Do not drink on an empty stomach. ? Limit how much you use to:  0-1 drink a day for women.  0-2 drinks a day for men. ? Be aware of how much alcohol is in your drink. In the [place] one drink equals one 12 oz bottle of beer (355 mL), one 5 oz glass of wine (148 mL), or one 1 oz glass of hard liquor (44 mL). ? Keep yourself hydrated with water, diet soda, or unsweetened iced tea.  Keep in mind that regular soda, juice, and other mixers may contain a lot of sugar and must be counted as carbs. What are tips for following this plan? Reading food labels  Start by checking the serving size on the "Nutrition Facts" label of packaged foods and drinks. The amount of calories, carbs, fats, and other nutrients listed on the label is based on one serving of the item. Many items contain more than one serving per package.  Check the total grams (g) of carbs in one serving. You can calculate the number of servings of carbs in one serving by dividing the total carbs by 15. For example, if a food has 30 g of total carbs per serving, it  would be equal to 2 servings of carbs.  Check the number of grams (g) of saturated fats and trans fats in one serving. Choose foods that have a low amount or none of these fats.  Check the number of milligrams (mg) of salt (sodium) in one serving. Most people should limit total sodium intake to less than 2,300 mg per day.  Always check the nutrition information of foods labeled as "low-fat" or "nonfat." These foods may be higher in added sugar or refined carbs and should be avoided.  Talk to your dietitian to identify your daily goals for  nutrients listed on the label. Shopping  Avoid buying canned, pre-made, or processed foods. These foods tend to be high in fat, sodium, and added sugar.  Shop around the outside edge of the grocery store. This is where you will most often find fresh fruits and vegetables, bulk grains, fresh meats, and fresh dairy. Cooking  Use low-heat cooking methods, such as baking, instead of high-heat cooking methods like deep frying.  Cook using healthy oils, such as olive, canola, or sunflower oil.  Avoid cooking with butter, cream, or high-fat meats. Meal planning  Eat meals and snacks regularly, preferably at the same times every day. Avoid going long periods of time without eating.  Eat foods that are high in fiber, such as fresh fruits, vegetables, beans, and whole grains. Talk with your dietitian about how many servings of carbs you can eat at each meal.  Eat 4-6 oz (112-168 g) of lean protein each day, such as lean meat, chicken, fish, eggs, or tofu. One ounce (oz) of lean protein is equal to: ? 1 oz (28 g) of meat, chicken, or fish. ? 1 egg. ?  cup (62 g) of tofu.  Eat some foods each day that contain healthy fats, such as avocado, nuts, seeds, and fish.   What foods should I eat? Fruits Berries. Apples. Oranges. Peaches. Apricots. Plums. Grapes. Mango. Papaya. Pomegranate. Kiwi. Cherries. Vegetables Lettuce. Spinach. Leafy greens, including kale, chard, collard greens, and mustard greens. Beets. Cauliflower. Cabbage. Broccoli. Carrots. Green beans. Tomatoes. Peppers. Onions. Cucumbers. Brussels sprouts. Grains Whole grains, such as whole-wheat or whole-grain bread, crackers, tortillas, cereal, and pasta. Unsweetened oatmeal. Quinoa. Brown or wild rice. Meats and other proteins Seafood. Poultry without skin. Lean cuts of poultry and beef. Tofu. Nuts. Seeds. Dairy Low-fat or fat-free dairy products such as milk, yogurt, and cheese. The items listed above may not be a complete list of  foods and beverages you can eat. Contact a dietitian for more information. What foods should I avoid? Fruits Fruits canned with syrup. Vegetables Canned vegetables. Frozen vegetables with butter or cream sauce. Grains Refined white flour and flour products such as bread, pasta, snack foods, and cereals. Avoid all processed foods. Meats and other proteins Fatty cuts of meat. Poultry with skin. Breaded or fried meats. Processed meat. Avoid saturated fats. Dairy Full-fat yogurt, cheese, or milk. Beverages Sweetened drinks, such as soda or iced tea. The items listed above may not be a complete list of foods and beverages you should avoid. Contact a dietitian for more information. Questions to ask a health care provider  Do I need to meet with a diabetes educator?  Do I need to meet with a dietitian?  What number can I call if I have questions?  When are the best times to check my blood glucose? Where to find more information:  American Diabetes Association: diabetes.org  Academy of Nutrition and Dietetics: www.eatright.org  [employer] of Diabetes and Digestive and Kidney Diseases: CarFlippers.tn  Association of Diabetes Care and Education Specialists: www.diabeteseducator.org Summary  It is important to have healthy eating habits because your blood sugar (glucose) levels are greatly affected by what you eat and drink.  A healthy meal plan will help you control your blood glucose and maintain a healthy lifestyle.  Your health care provider may recommend that you work with a dietitian to make a meal plan that is best for you.  Keep in mind that carbohydrates (carbs) and alcohol have immediate effects on your blood glucose levels. It is important to count carbs and to use alcohol carefully. This information is not intended to replace advice given to you by your health care provider. Make sure you discuss any questions you have with your health care provider. Document  Revised: 10/15/2019 Document Reviewed: 10/15/2019 Elsevier Patient Education  2021 [employer].

## 2021-02-09 LAB — COMPREHENSIVE METABOLIC PANEL
ALT: 27 IU/L (ref 0–44)
AST: 15 IU/L (ref 0–40)
Albumin/Globulin Ratio: 1.7 (ref 1.2–2.2)
Albumin: 4.4 g/dL (ref 3.8–4.9)
Alkaline Phosphatase: 38 IU/L — ABNORMAL LOW (ref 44–121)
BUN/Creatinine Ratio: 18 (ref 9–20)
BUN: 16 mg/dL (ref 6–24)
Bilirubin Total: 0.2 mg/dL (ref 0.0–1.2)
CO2: 21 mmol/L (ref 20–29)
Calcium: 10.3 mg/dL — ABNORMAL HIGH (ref 8.7–10.2)
Chloride: 102 mmol/L (ref 96–106)
Creatinine, Ser: 0.9 mg/dL (ref 0.76–1.27)
Globulin, Total: 2.6 g/dL (ref 1.5–4.5)
Glucose: 129 mg/dL — ABNORMAL HIGH (ref 65–99)
Potassium: 4.5 mmol/L (ref 3.5–5.2)
Sodium: 139 mmol/L (ref 134–144)
Total Protein: 7 g/dL (ref 6.0–8.5)
eGFR: 99 mL/min/{1.73_m2} (ref >59)

## 2021-02-09 LAB — CBC WITH DIFFERENTIAL/PLATELET
Basophils Absolute: 0.1 10*3/uL (ref 0.0–0.2)
Basos: 1 %
EOS (ABSOLUTE): 0.3 10*3/uL (ref 0.0–0.4)
Eos: 4 %
Hematocrit: 46.4 % (ref 37.5–51.0)
Hemoglobin: 15.4 g/dL (ref 13.0–17.7)
Immature Grans (Abs): 0 10*3/uL (ref 0.0–0.1)
Immature Granulocytes: 0 %
Lymphocytes Absolute: 1.5 10*3/uL (ref 0.7–3.1)
Lymphs: 20 %
MCH: 29.2 pg (ref 26.6–33.0)
MCHC: 33.2 g/dL (ref 31.5–35.7)
MCV: 88 fL (ref 79–97)
Monocytes Absolute: 0.6 10*3/uL (ref 0.1–0.9)
Monocytes: 7 %
Neutrophils Absolute: 5.2 10*3/uL (ref 1.4–7.0)
Neutrophils: 68 %
Platelets: 291 10*3/uL (ref 150–450)
RBC: 5.27 x10E6/uL (ref 4.14–5.80)
RDW: 13.2 % (ref 11.6–15.4)
WBC: 7.7 10*3/uL (ref 3.4–10.8)

## 2021-02-09 LAB — LIPID PANEL
Chol/HDL Ratio: 2.6 ratio (ref 0.0–5.0)
Cholesterol, Total: 149 mg/dL (ref 100–199)
HDL: 57 mg/dL (ref >39)
LDL Chol Calc (NIH): 76 mg/dL (ref 0–99)
Triglycerides: 86 mg/dL (ref 0–149)
VLDL Cholesterol Cal: 16 mg/dL (ref 5–40)

## 2021-02-09 LAB — HEMOGLOBIN A1C
Est. average glucose Bld gHb Est-mCnc: 154 mg/dL
Hgb A1c MFr Bld: 7 % — ABNORMAL HIGH (ref 4.8–5.6)

## 2021-02-09 LAB — TSH: TSH: 1.62 u[IU]/mL (ref 0.450–4.500)

## 2021-02-09 LAB — CARDIOVASCULAR RISK ASSESSMENT

## 2021-02-19 ENCOUNTER — Other Ambulatory Visit: Payer: Self-pay | Admitting: Family Medicine

## 2021-02-19 DIAGNOSIS — E1142 Type 2 diabetes mellitus with diabetic polyneuropathy: Secondary | ICD-10-CM

## 2021-02-22 ENCOUNTER — Encounter: Payer: Self-pay | Admitting: Nurse Practitioner

## 2021-02-22 ENCOUNTER — Telehealth (INDEPENDENT_AMBULATORY_CARE_PROVIDER_SITE_OTHER): Payer: 59 | Admitting: Nurse Practitioner

## 2021-02-22 VITALS — Wt 280.0 lb

## 2021-02-22 DIAGNOSIS — J310 Chronic rhinitis: Secondary | ICD-10-CM | POA: Diagnosis not present

## 2021-02-22 DIAGNOSIS — J329 Chronic sinusitis, unspecified: Secondary | ICD-10-CM | POA: Diagnosis not present

## 2021-02-22 DIAGNOSIS — J301 Allergic rhinitis due to pollen: Secondary | ICD-10-CM

## 2021-02-22 MED ORDER — AMOXICILLIN-POT CLAVULANATE 875-125 MG PO TABS: 1.0000 | ORAL_TABLET | Freq: Two times a day (BID) | ORAL | 0 refills | Status: DC

## 2021-02-22 MED ORDER — LORATADINE 10 MG PO TABS: 10.0000 mg | ORAL_TABLET | Freq: Every day | ORAL | 1 refills | Status: AC

## 2021-02-22 NOTE — Progress Notes (Addendum)
This visit type was conducted due to national recommendations for restrictions regarding the COVID-19 Pandemic (e.g. social distancing) in an effort to limit this patient's exposure and mitigate transmission in our community.  Due to her co-morbid illnesses, this patient is at least at moderate risk for complications without adequate follow up.  This format is felt to be most appropriate for this patient at this time.  The patient did not have access to video technology/had technical difficulties with video requiring transitioning to audio format only (telephone).  All issues noted in this document were discussed and addressed.  No physical exam could be performed with this format.  Patient verbally consented to a telehealth visit.  Virtual Visit via Telephone Note   Patient Location: Home Provider Location: Office/Clinic  PCP:  [name], NP             Evaluation Performed:  Established patient, acute telemedicine    Subjective:    Patient ID: Russell Klein, male    DOB: 03-Feb-1961, 60 y.o.   MRN: [id number]  Chief Complaint  Patient presents with  . Allergic rhinitis        HPI Russell Klein is a 60 year old Caucasian male that presents for telemedicine visit with nasal congestion, dizziness, nausea, and headache.Onset of symptoms was 2-weeks ago.Treatment includes Bonine OTC motion sickness medication and Flonase nasal spray. He has not taken Allegra or Xyzal OTC antihistamine. He has been outdoors exposed to pollen and environmental allergens. States he was unable to complete mowing his yard due to dizziness and nausea.He has a past medical history of type 2 DM, states blood glucose is 147.   Past Medical History:  Diagnosis Date  . Back pain   . Chronic pain syndrome   . Diabetes (HCC)   . History of stomach ulcers   . Hyperlipidemia   . Hyperparathyroidism (HCC)   . Hypertension   . Primary insomnia   . Pulmonary embolism (HCC)   . Sleep apnea   . Thyroid disease      Past Surgical History:  Procedure Laterality Date  . APPENDECTOMY    . Exploratory Laporatomy    . LEFT HEART CATH AND CORONARY ANGIOGRAPHY N/A 09/18/2018   Procedure: LEFT HEART CATH AND CORONARY ANGIOGRAPHY;  Surgeon: Marykay Lex, MD;  Location: [facility] INVASIVE CV LAB;  Service: Cardiovascular;  Laterality: N/A;  . PARATHYROIDECTOMY      Family History  Problem Relation Age of Onset  . Stroke Mother   . Diabetes Mother   . Cushing syndrome Father   . Thyroid disease Sister     Social History   Socioeconomic History  . Marital status: Single    Spouse name: Not on file  . Number of children: 2  . Years of education: Not on file  . Highest education level: Not on file  Occupational History    Comment: [employer]  Tobacco Use  . Smoking status: Former Smoker    Types: Cigars    Quit date: 2013    Years since quitting: 9.2  . Smokeless tobacco: Never Used  Vaping Use  . Vaping Use: Never used  Substance and Sexual Activity  . Alcohol use: Not Currently  . Drug use: Never  . Sexual activity: Yes    Partners: Female  Other Topics Concern  . Not on file  Social History Narrative      Outpatient Medications Prior to Visit  Medication Sig Dispense Refill  . aspirin EC 81 MG tablet Take 81  mg by mouth daily.    Marland Kitchen atorvastatin (LIPITOR) 20 MG tablet Take 1 tablet (20 mg total) by mouth daily. 90 tablet 0  . bupivacaine (MARCAINE) 0.25 % injection Inject into the skin.    . Cholecalciferol (VITAMIN D3) 5000 units TABS Take 1 tablet by mouth daily.    . dapagliflozin propanediol (FARXIGA) 10 MG TABS tablet Take 1 tablet (10 mg total) by mouth daily before breakfast. 90 tablet 0  . diclofenac Sodium (VOLTAREN) 1 % GEL Apply 4 grams to R knee 2 to 3 times a day prn pain    . FLUoxetine (PROZAC) 20 MG tablet Take 20 mg by mouth daily.    . fluticasone (FLONASE) 50 MCG/ACT nasal spray Place into the nose.    Marland Kitchen lisinopril (ZESTRIL) 20 MG tablet Take 1  tablet by mouth once daily 90 tablet 0  . loratadine (CLARITIN) 10 MG tablet Take by mouth.    . meloxicam (MOBIC) 15 MG tablet Take 1 tablet (15 mg total) by mouth daily. 30 tablet 2  . metFORMIN (GLUCOPHAGE) 1000 MG tablet Take 1 tablet (1,000 mg total) by mouth 2 (two) times daily with a meal. 180 tablet 3  . methocarbamol (ROBAXIN) 750 MG tablet Take 1 tablet (750 mg total) by mouth 4 (four) times daily. 120 tablet 0  . metoprolol succinate (TOPROL-XL) 25 MG 24 hr tablet Take 25 mg by mouth daily.  0  . nitroGLYCERIN (NITROSTAT) 0.4 MG SL tablet Place 0.4 mg under the tongue every 5 (five) minutes as needed for chest pain.   1  . OZEMPIC, 1 MG/DOSE, 4 MG/3ML SOPN INJECT 1 MG  SUBCUTANEOUSLY ONCE  A  WEEK 3 mL 0  . pantoprazole (PROTONIX) 40 MG tablet Take 1 tablet by mouth once daily 90 tablet 0  . pioglitazone (ACTOS) 15 MG tablet Take 1 tablet by mouth once daily 90 tablet 0  . traMADol-acetaminophen (ULTRACET) 37.5-325 MG tablet Take 2 tablets by mouth every 6 (six) hours as needed. 48 tablet 0  . traZODone (DESYREL) 50 MG tablet Take 1 tablet (50 mg total) by mouth at bedtime. 90 tablet 0  . triamcinolone acetonide (TRIESENCE) 40 MG/ML SUSP Inject into the articular space.    Marland Kitchen azithromycin (ZITHROMAX) 250 MG tablet 2 pills daily x 3 days 6 each 0   No facility-administered medications prior to visit.    Allergies  Allergen Reactions  . Invokamet [Canagliflozin-Metformin Hcl] Nausea Only    Review of Systems  Constitutional: Positive for fatigue.  HENT: Positive for congestion, ear pain (bilateral ear fullness), postnasal drip, rhinorrhea, sinus pressure, sinus pain and sore throat. Negative for voice change.   Eyes: Negative for pain.  Respiratory: Positive for cough. Negative for chest tightness and shortness of breath.   Cardiovascular: Negative for chest pain, palpitations and leg swelling.  Gastrointestinal: Positive for nausea.  Musculoskeletal: Positive for arthralgias  (chronic, OA).  Allergic/Immunologic: Positive for environmental allergies.  Neurological: Positive for dizziness and headaches.       Objective:    Physical Exam Vitals reviewed.   No physical exam performed due to telemedicine visit Wt 280 lb (127 kg)   BMI 41.35 kg/m  Wt Readings from Last 3 Encounters:  02/22/21 280 lb (127 kg)  02/08/21 280 lb 6.4 oz (127.2 kg)  11/09/20 281 lb (127.5 kg)    Health Maintenance Due  Topic Date Due  . Hepatitis C Screening  Never done  . OPHTHALMOLOGY EXAM  Never done  . HIV Screening  Never done  . COVID-19 Vaccine (3 - Booster for Pfizer series) 09/15/2020       Lab Results  Component Value Date   TSH 1.620 02/08/2021   Lab Results  Component Value Date   WBC 7.7 02/08/2021   HGB 15.4 02/08/2021   HCT 46.4 02/08/2021   MCV 88 02/08/2021   PLT 291 02/08/2021   Lab Results  Component Value Date   NA 139 02/08/2021   K 4.5 02/08/2021   CO2 21 02/08/2021   GLUCOSE 129 (H) 02/08/2021   BUN 16 02/08/2021   CREATININE 0.90 02/08/2021   BILITOT 0.2 02/08/2021   ALKPHOS 38 (L) 02/08/2021   AST 15 02/08/2021   ALT 27 02/08/2021   PROT 7.0 02/08/2021   ALBUMIN 4.4 02/08/2021   CALCIUM 10.3 (H) 02/08/2021   Lab Results  Component Value Date   CHOL 149 02/08/2021   Lab Results  Component Value Date   HDL 57 02/08/2021   Lab Results  Component Value Date   LDLCALC 76 02/08/2021   Lab Results  Component Value Date   TRIG 86 02/08/2021   Lab Results  Component Value Date   CHOLHDL 2.6 02/08/2021   Lab Results  Component Value Date   HGBA1C 7.0 (H) 02/08/2021       Assessment & Plan:   1. Rhinosinusitis - amoxicillin-clavulanate (AUGMENTIN) 875-125 MG tablet; Take 1 tablet by mouth 2 (two) times daily.  Dispense: 20 tablet; Refill: 0  2. Seasonal allergic rhinitis due to pollen - loratadine (CLARITIN) 10 MG tablet; Take 1 tablet (10 mg total) by mouth daily.  Dispense: 90 tablet; Refill: 1   Rest and  push fluids Change positions slowly Notify office if symptoms fail to improve or worsen Continue daily Flonase nasal spray and all other prescribed medications  Follow-up: As needed   I spent 10 minutes dedicated to the care of this patient on the date of this encounter to include telephone time with the patient, as well as:EMR and prescription medication management.  Signed, [name], NP

## 2021-03-15 ENCOUNTER — Telehealth: Payer: Self-pay

## 2021-03-15 NOTE — Telephone Encounter (Signed)
I left a message on the number(s) listed in the patients chart requesting the patient to call back to rescheduled appointment for 05/13/2021. The provider is out of the office for 05/13/2021. Appointment has been canceled. Waiting for the patient to return the call. 

## 2021-03-16 ENCOUNTER — Other Ambulatory Visit: Payer: Self-pay | Admitting: Nurse Practitioner

## 2021-03-16 DIAGNOSIS — J329 Chronic sinusitis, unspecified: Secondary | ICD-10-CM

## 2021-03-16 DIAGNOSIS — J310 Chronic rhinitis: Secondary | ICD-10-CM

## 2021-03-18 ENCOUNTER — Encounter: Payer: Self-pay | Admitting: Family Medicine

## 2021-03-18 ENCOUNTER — Telehealth (INDEPENDENT_AMBULATORY_CARE_PROVIDER_SITE_OTHER): Payer: 59 | Admitting: Family Medicine

## 2021-03-18 VITALS — BP 142/85 | HR 84 | Temp 97.7°F | Ht 69.0 in | Wt 280.0 lb

## 2021-03-18 DIAGNOSIS — J029 Acute pharyngitis, unspecified: Secondary | ICD-10-CM

## 2021-03-18 DIAGNOSIS — R197 Diarrhea, unspecified: Secondary | ICD-10-CM

## 2021-03-18 DIAGNOSIS — J3089 Other allergic rhinitis: Secondary | ICD-10-CM | POA: Diagnosis not present

## 2021-03-18 MED ORDER — MONTELUKAST SODIUM 10 MG PO TABS: 10.0000 mg | ORAL_TABLET | Freq: Every day | ORAL | 0 refills | Status: AC

## 2021-03-18 NOTE — Progress Notes (Signed)
Virtual Visit via Telephone Note   This visit type was conducted due to national recommendations for restrictions regarding the COVID-19 Pandemic (e.g. social distancing) in an effort to limit this patient's exposure and mitigate transmission in our community.  Due to his co-morbid illnesses, this patient is at least at moderate risk for complications without adequate follow up.  This format is felt to be most appropriate for this patient at this time.  The patient did not have access to video technology/had technical difficulties with video requiring transitioning to audio format only (telephone).  All issues noted in this document were discussed and addressed.  No physical exam could be performed with this format.  Patient verbally consented to a telehealth visit.   Date:  03/18/2021   ID:  Russell Klein, DOB 1961-10-19, MRN [id number]  Patient Location: Home Provider Location: Office/Clinic  PCP:  Blane Ohara, MD   Evaluation Performed:  Acute  Chief Complaint:  Sore throat  History of Present Illness:    Russell Klein is a 60 y.o. male states he had a sore throat Friday night. C/o headache, PND, and some burning in his nose (held his Flonase).  And denies a fever/chills/sob. Taking Claritin 10 mg once daily.  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  He does not wear a mask while he is doing this because it is already very hot and causes some shortness of breath.  Patient also has had diarrhea several times per day for the last week.  He thought it might have been his metformin but it sporadically has happened since starting this over the last 6 months.  He did just complete a course of Augmentin in early April. The patient does not have symptoms concerning for COVID-19 infection (fever, chills, cough, or new shortness of breath).    Past Medical History:  Diagnosis Date  . Back pain   . Chronic pain syndrome   . Diabetes (HCC)   . History of  stomach ulcers   . Hyperlipidemia   . Hyperparathyroidism (HCC)   . Hypertension   . Primary insomnia   . Pulmonary embolism (HCC)   . Sleep apnea   . Thyroid disease     Past Surgical History:  Procedure Laterality Date  . APPENDECTOMY    . Exploratory Laporatomy    . LEFT HEART CATH AND CORONARY ANGIOGRAPHY N/A 09/18/2018   Procedure: LEFT HEART CATH AND CORONARY ANGIOGRAPHY;  Surgeon: Marykay Lex, MD;  Location: [facility] INVASIVE CV LAB;  Service: Cardiovascular;  Laterality: N/A;  . PARATHYROIDECTOMY      Family History  Problem Relation Age of Onset  . Stroke Mother   . Diabetes Mother   . Cushing syndrome Father   . Thyroid disease Sister     Social History   Socioeconomic History  . Marital status: Single    Spouse name: Not on file  . Number of children: 2  . Years of education: Not on file  . Highest education level: Not on file  Occupational History    Comment: [employer]  Tobacco Use  . Smoking status: Former Smoker    Types: Cigars    Quit date: 2013    Years since quitting: 9.3  . Smokeless tobacco: Never Used  Vaping Use  . Vaping Use: Never used  Substance and Sexual Activity  . Alcohol use: Not Currently  . Drug use: Never  . Sexual activity: Yes    Partners: Female  Other  Topics Concern  . Not on file  Social History Narrative  . Not on file   Social Determinants of Health   Financial Resource Strain: Not on file  Food Insecurity: Not on file  Transportation Needs: Not on file  Physical Activity: Not on file  Stress: Not on file  Social Connections: Not on file  Intimate Partner Violence: Not on file    Outpatient Medications Prior to Visit  Medication Sig Dispense Refill  . aspirin EC 81 MG tablet Take 81 mg by mouth daily.    Marland Kitchen atorvastatin (LIPITOR) 20 MG tablet Take 1 tablet (20 mg total) by mouth daily. 90 tablet 0  . Cholecalciferol (VITAMIN D3) 5000 units TABS Take 1 tablet by mouth daily.    . dapagliflozin  propanediol (FARXIGA) 10 MG TABS tablet Take 1 tablet (10 mg total) by mouth daily before breakfast. 90 tablet 0  . diclofenac Sodium (VOLTAREN) 1 % GEL Apply 4 grams to R knee 2 to 3 times a day prn pain    . FLUoxetine (PROZAC) 20 MG tablet Take 20 mg by mouth daily.    . fluticasone (FLONASE) 50 MCG/ACT nasal spray Place into the nose.    Marland Kitchen lisinopril (ZESTRIL) 20 MG tablet Take 1 tablet by mouth once daily 90 tablet 0  . loratadine (CLARITIN) 10 MG tablet Take 1 tablet (10 mg total) by mouth daily. 90 tablet 1  . meloxicam (MOBIC) 15 MG tablet Take 1 tablet (15 mg total) by mouth daily. 30 tablet 2  . metFORMIN (GLUCOPHAGE) 1000 MG tablet Take 1 tablet (1,000 mg total) by mouth 2 (two) times daily with a meal. 180 tablet 3  . methocarbamol (ROBAXIN) 750 MG tablet Take 1 tablet (750 mg total) by mouth 4 (four) times daily. 120 tablet 0  . metoprolol succinate (TOPROL-XL) 25 MG 24 hr tablet Take 25 mg by mouth daily.  0  . nitroGLYCERIN (NITROSTAT) 0.4 MG SL tablet Place 0.4 mg under the tongue every 5 (five) minutes as needed for chest pain.   1  . OZEMPIC, 1 MG/DOSE, 4 MG/3ML SOPN INJECT 1 MG  SUBCUTANEOUSLY ONCE  A  WEEK 3 mL 0  . pantoprazole (PROTONIX) 40 MG tablet Take 1 tablet by mouth once daily 90 tablet 0  . pioglitazone (ACTOS) 15 MG tablet Take 1 tablet by mouth once daily 90 tablet 0  . traMADol-acetaminophen (ULTRACET) 37.5-325 MG tablet Take 2 tablets by mouth every 6 (six) hours as needed. 48 tablet 0  . traZODone (DESYREL) 50 MG tablet Take 1 tablet (50 mg total) by mouth at bedtime. 90 tablet 0  . triamcinolone acetonide (TRIESENCE) 40 MG/ML SUSP Inject into the articular space.    Marland Kitchen amoxicillin-clavulanate (AUGMENTIN) 875-125 MG tablet Take 1 tablet by mouth 2 (two) times daily. 20 tablet 0  . bupivacaine (MARCAINE) 0.25 % injection Inject into the skin.     No facility-administered medications prior to visit.    Allergies:   Invokamet [canagliflozin-metformin hcl]    Social History   Tobacco Use  . Smoking status: Former Smoker    Types: Cigars    Quit date: 2013    Years since quitting: 9.3  . Smokeless tobacco: Never Used  Vaping Use  . Vaping Use: Never used  Substance Use Topics  . Alcohol use: Not Currently  . Drug use: Never     Review of Systems  Constitutional: Positive for weight loss. Negative for chills and fever.  HENT: Positive for congestion, ear pain (  popping) and sore throat. Negative for sinus pain.   Respiratory: Positive for cough. Negative for shortness of breath.   Gastrointestinal: Positive for diarrhea (x 1 week (several times per day. Watery.)). Negative for abdominal pain and vomiting.  Musculoskeletal: Positive for joint pain. Negative for myalgias.  Neurological: Positive for headaches.  Endo/Heme/Allergies: Positive for environmental allergies.     Labs/Other Tests and Data Reviewed:    Recent Labs: 02/08/2021: ALT 27; BUN 16; Creatinine, Ser 0.90; Hemoglobin 15.4; Platelets 291; Potassium 4.5; Sodium 139; TSH 1.620   Recent Lipid Panel Lab Results  Component Value Date/Time   CHOL 149 02/08/2021 09:37 AM   TRIG 86 02/08/2021 09:37 AM   HDL 57 02/08/2021 09:37 AM   CHOLHDL 2.6 02/08/2021 09:37 AM   LDLCALC 76 02/08/2021 09:37 AM    Wt Readings from Last 3 Encounters:  03/18/21 280 lb (127 kg)  02/22/21 280 lb (127 kg)  02/08/21 280 lb 6.4 oz (127.2 kg)     Objective:    Vital Signs:  BP (!) 142/85   Pulse 84   Temp 97.7 F (36.5 C)   Ht 5\' 9"  (1.753 m)   Wt 280 lb (127 kg)   BMI 41.35 kg/m    Physical Exam  Hoarse  ASSESSMENT & PLAN:   1. Diarrhea of presumed infectious origin - Cdiff NAA+O+P+Stool Culture.  Patient to come pick these up tomorrow.  2. Non-seasonal allergic rhinitis due to other allergic trigger Add Singulair 10 mg once daily. Hold Flonase for 1 week.  Recommend nasal saline.  3. Sore throat Likely secondary to postnasal drainage.  Orders Placed This Encounter   Procedures  . Cdiff NAA+O+P+Stool Culture     Meds ordered this encounter  Medications  . montelukast (SINGULAIR) 10 MG tablet    Sig: Take 1 tablet (10 mg total) by mouth at bedtime.    Dispense:  90 tablet    Refill:  0    COVID-19 Education: The signs and symptoms of COVID-19 were discussed with the patient and how to seek care for testing (follow up with PCP or arrange E-visit). The importance of social distancing was discussed today.   I spent 9 minutes dedicated to the care of this patient on the date of this encounter over the phone Follow Up:  In Person prn  Signed,  , MD  03/18/2021 3:36 PM    [facility]

## 2021-03-21 ENCOUNTER — Other Ambulatory Visit: Payer: Self-pay | Admitting: Family Medicine

## 2021-03-23 ENCOUNTER — Other Ambulatory Visit: Payer: Self-pay | Admitting: Family Medicine

## 2021-03-23 DIAGNOSIS — E1142 Type 2 diabetes mellitus with diabetic polyneuropathy: Secondary | ICD-10-CM

## 2021-03-31 LAB — HM DIABETES EYE EXAM

## 2021-04-06 ENCOUNTER — Encounter: Payer: Self-pay | Admitting: Family Medicine

## 2021-04-12 ENCOUNTER — Other Ambulatory Visit: Payer: Self-pay | Admitting: Physician Assistant

## 2021-04-14 ENCOUNTER — Other Ambulatory Visit: Payer: Self-pay | Admitting: Physician Assistant

## 2021-04-14 DIAGNOSIS — E1142 Type 2 diabetes mellitus with diabetic polyneuropathy: Secondary | ICD-10-CM

## 2021-04-20 ENCOUNTER — Other Ambulatory Visit: Payer: Self-pay | Admitting: Family Medicine

## 2021-05-12 ENCOUNTER — Other Ambulatory Visit: Payer: Self-pay | Admitting: Family Medicine

## 2021-05-12 ENCOUNTER — Other Ambulatory Visit: Payer: Self-pay | Admitting: Physician Assistant

## 2021-05-12 DIAGNOSIS — E1142 Type 2 diabetes mellitus with diabetic polyneuropathy: Secondary | ICD-10-CM

## 2021-05-13 ENCOUNTER — Ambulatory Visit: Payer: 59 | Admitting: Family Medicine

## 2021-05-26 ENCOUNTER — Other Ambulatory Visit: Payer: Self-pay

## 2021-05-26 ENCOUNTER — Ambulatory Visit: Payer: 59 | Admitting: Family Medicine

## 2021-05-26 VITALS — BP 120/64 | HR 100 | Temp 97.8°F | Resp 18 | Ht 69.0 in | Wt 278.0 lb

## 2021-05-26 DIAGNOSIS — F331 Major depressive disorder, recurrent, moderate: Secondary | ICD-10-CM

## 2021-05-26 DIAGNOSIS — M170 Bilateral primary osteoarthritis of knee: Secondary | ICD-10-CM | POA: Diagnosis not present

## 2021-05-26 DIAGNOSIS — E782 Mixed hyperlipidemia: Secondary | ICD-10-CM | POA: Diagnosis not present

## 2021-05-26 DIAGNOSIS — E1142 Type 2 diabetes mellitus with diabetic polyneuropathy: Secondary | ICD-10-CM

## 2021-05-26 DIAGNOSIS — I10 Essential (primary) hypertension: Secondary | ICD-10-CM | POA: Diagnosis not present

## 2021-05-26 DIAGNOSIS — M5442 Lumbago with sciatica, left side: Secondary | ICD-10-CM

## 2021-05-26 DIAGNOSIS — H9201 Otalgia, right ear: Secondary | ICD-10-CM

## 2021-05-26 DIAGNOSIS — M5441 Lumbago with sciatica, right side: Secondary | ICD-10-CM

## 2021-05-26 LAB — POCT UA - MICROALBUMIN: Microalbumin Ur, POC: 10 mg/L

## 2021-05-26 MED ORDER — FLUOXETINE HCL 40 MG PO CAPS: 40.0000 mg | ORAL_CAPSULE | Freq: Every day | ORAL | 0 refills | Status: DC

## 2021-05-26 MED ORDER — SEMAGLUTIDE (2 MG/DOSE) 8 MG/3ML ~~LOC~~ SOPN: 2.0000 mg | PEN_INJECTOR | SUBCUTANEOUS | 3 refills | Status: DC

## 2021-05-26 NOTE — Patient Instructions (Addendum)
Stop actos Increase ozempic to 2 mg weekly.  Recommend continue to work on eating healthy diet and exercise. Call back with name of ENT referral. Increase prozac 40 mg daily.

## 2021-05-26 NOTE — Progress Notes (Signed)
Subjective:  Patient ID: Russell Klein, male    DOB: 1961/01/11  Age: 60 y.o. MRN: [id number]  Chief Complaint  Patient presents with   Hypertension   Diabetes   Hyperlipidemia    HPI   Essential hypertension Takes lisinopril 20 mg daily and metoprolol 25 mg daily  Mixed hyperlipidemia Taking lipitor 20 mg daily. Tries to eat healthy, Very active.   Diabetic polyneuropathy associated with type 2 diabetes mellitus (HCC) Controlled with farxiga 10 mg daily, ozempic 1 mg once weekly, actos 15 mg daily and metformin 1000 mg twice a day. Sugars checked daily. 130s.   Chronic bilateral low back pain without sciatica Robaxin 750 mg four times daily and tramadol 37.5-325 mg help.  GERD Protonix 40 mg daily  Insomnia Trazodone 50 mg at bedtime  OA Knee-uses voltaren gel  Low Back Pain controlled with mobic     Current Outpatient Medications on File Prior to Visit  Medication Sig Dispense Refill   aspirin EC 81 MG tablet Take 81 mg by mouth daily.     atorvastatin (LIPITOR) 20 MG tablet Take 1 tablet by mouth once daily 90 tablet 0   Cholecalciferol (VITAMIN D3) 5000 units TABS Take 1 tablet by mouth daily.     diclofenac Sodium (VOLTAREN) 1 % GEL Apply 4 grams to R knee 2 to 3 times a day prn pain     FARXIGA 10 MG TABS tablet TAKE 1 TABLET BY MOUTH ONCE DAILY BEFORE BREAKFAST 90 tablet 0   fluticasone (FLONASE) 50 MCG/ACT nasal spray Place into the nose.     lisinopril (ZESTRIL) 20 MG tablet Take 1 tablet by mouth once daily 90 tablet 0   loratadine (CLARITIN) 10 MG tablet Take 1 tablet (10 mg total) by mouth daily. 90 tablet 1   meloxicam (MOBIC) 15 MG tablet Take 1 tablet (15 mg total) by mouth daily. 30 tablet 2   metFORMIN (GLUCOPHAGE) 1000 MG tablet Take 1 tablet (1,000 mg total) by mouth 2 (two) times daily with a meal. 180 tablet 3   methocarbamol (ROBAXIN) 750 MG tablet Take 1 tablet (750 mg total) by mouth 4 (four) times daily. 120 tablet 0   metoprolol succinate  (TOPROL-XL) 25 MG 24 hr tablet Take 25 mg by mouth daily.  0   montelukast (SINGULAIR) 10 MG tablet Take 1 tablet (10 mg total) by mouth at bedtime. 90 tablet 0   nitroGLYCERIN (NITROSTAT) 0.4 MG SL tablet Place 0.4 mg under the tongue every 5 (five) minutes as needed for chest pain.   1   pantoprazole (PROTONIX) 40 MG tablet Take 1 tablet by mouth once daily 90 tablet 3   pioglitazone (ACTOS) 15 MG tablet Take 1 tablet by mouth once daily 90 tablet 0   traMADol-acetaminophen (ULTRACET) 37.5-325 MG tablet Take 2 tablets by mouth every 6 (six) hours as needed. 48 tablet 0   traZODone (DESYREL) 50 MG tablet Take 1 tablet (50 mg total) by mouth at bedtime. 90 tablet 0   triamcinolone acetonide (TRIESENCE) 40 MG/ML SUSP Inject into the articular space.     No current facility-administered medications on file prior to visit.   Past Medical History:  Diagnosis Date   Back pain    Chronic pain syndrome    Diabetes (HCC)    History of stomach ulcers    Hyperlipidemia    Hyperparathyroidism (HCC)    Hypertension    Primary insomnia    Pulmonary embolism (HCC)    Sleep apnea  Thyroid disease    Past Surgical History:  Procedure Laterality Date   APPENDECTOMY     Exploratory Laporatomy     LEFT HEART CATH AND CORONARY ANGIOGRAPHY N/A 09/18/2018   Procedure: LEFT HEART CATH AND CORONARY ANGIOGRAPHY;  Surgeon: Marykay Lex, MD;  Location: [facility] INVASIVE CV LAB;  Service: Cardiovascular;  Laterality: N/A;   PARATHYROIDECTOMY      Family History  Problem Relation Age of Onset   Stroke Mother    Diabetes Mother    Cushing syndrome Father    Thyroid disease Sister    Social History   Socioeconomic History   Marital status: Single    Spouse name: Not on file   Number of children: 2   Years of education: Not on file   Highest education level: Not on file  Occupational History    Comment: [employer]  Tobacco Use   Smoking status: Former    Types: Cigars    Quit date:  2013    Years since quitting: 9.5   Smokeless tobacco: Never  Vaping Use   Vaping Use: Never used  Substance and Sexual Activity   Alcohol use: Not Currently   Drug use: Never   Sexual activity: Yes    Partners: Female  Other Topics Concern   Not on file  Social History Narrative   Not on file   Social Determinants of Health   Financial Resource Strain: Not on file  Food Insecurity: Not on file  Transportation Needs: Not on file  Physical Activity: Not on file  Stress: Not on file  Social Connections: Not on file    Review of Systems  Constitutional:  Positive for fatigue. Negative for chills and fever.  HENT:  Positive for congestion and ear pain (Right ear "popping"). Negative for rhinorrhea and sore throat.   Respiratory:  Negative for cough and shortness of breath.   Cardiovascular:  Negative for chest pain and palpitations.  Gastrointestinal:  Positive for nausea (sinus drainage) and vomiting. Negative for abdominal pain and constipation.  Genitourinary:  Negative for dysuria and urgency.  Musculoskeletal:  Positive for arthralgias (bilateral knee pain) and myalgias. Negative for back pain.  Neurological:  Negative for dizziness and headaches.  Psychiatric/Behavioral:  Negative for dysphoric mood. The patient is not nervous/anxious.     Objective:  BP 120/64   Pulse 100   Temp 97.8 F (36.6 C)   Resp 18   Ht 5\' 9"  (1.753 m)   Wt 278 lb (126.1 kg)   BMI 41.05 kg/m   BP/Weight 05/26/2021 03/18/2021 02/22/2021  Systolic BP 120 142 -  Diastolic BP 64 85 -  Wt. (Lbs) 278 280 280  BMI 41.05 41.35 41.35    Physical Exam Vitals reviewed.  Constitutional:      Appearance: Normal appearance. He is obese.  Cardiovascular:     Rate and Rhythm: Normal rate and regular rhythm.     Heart sounds: No murmur heard. Pulmonary:     Effort: Pulmonary effort is normal.     Breath sounds: Normal breath sounds.  Abdominal:     General: Abdomen is flat. Bowel sounds are  normal.     Palpations: Abdomen is soft.     Tenderness: There is no abdominal tenderness.  Musculoskeletal:     Lumbar back: Tenderness present.  Neurological:     Mental Status: He is alert and oriented to person, place, and time.  Psychiatric:        Mood and Affect:  Mood normal.        Behavior: Behavior normal.    Diabetic Foot Exam - Simple   Simple Foot Form Visual Inspection No deformities, no ulcerations, no other skin breakdown bilaterally: Yes Sensation Testing Intact to touch and monofilament testing bilaterally: Yes Pulse Check Posterior Tibialis and Dorsalis pulse intact bilaterally: Yes Comments      Lab Results  Component Value Date   WBC 7.1 05/26/2021   HGB 15.0 05/26/2021   HCT 46.2 05/26/2021   PLT 292 05/26/2021   GLUCOSE 148 (H) 05/26/2021   CHOL 137 05/26/2021   TRIG 128 05/26/2021   HDL 52 05/26/2021   LDLCALC 63 05/26/2021   ALT 25 05/26/2021   AST 15 05/26/2021   NA 141 05/26/2021   K 5.1 05/26/2021   CL 104 05/26/2021   CREATININE 1.05 05/26/2021   BUN 19 05/26/2021   CO2 21 05/26/2021   TSH 1.620 02/08/2021   PSA 0.6 12/03/2019   HGBA1C 6.8 (H) 05/26/2021   MICROALBUR 10 05/26/2021      Assessment & Plan:   1. Diabetic polyneuropathy associated with type 2 diabetes mellitus (HCC) Recommend check sugars fasting daily. Recommend check feet daily. Recommend annual eye exams. Continue to work on eating a healthy diet and exercise.  Labs drawn today.    - POCT UA - Microalbumin - Hemoglobin A1c Stop actos increase Ozempic - Semaglutide, 2 MG/DOSE, 8 MG/3ML SOPN; Inject 2 mg into the skin once a week.  Dispense: 3 mL; Refill: 3  2. Essential hypertension Well controlled.  No changes to medicines.  Continue to work on eating a healthy diet and exercise.  Labs drawn today.  - CBC with Differential/Platelet - Comprehensive metabolic panel  3. Mixed hyperlipidemia Well controlled.  No changes to medicines.  Continue to work  on eating a healthy diet and exercise.  Labs drawn today.  - Lipid panel  4. Primary osteoarthritis of both knees The current medical regimen is effective;  continue present plan and medications.  5. Morbid obesity (HCC) Recommend continue to work on eating healthy diet and exercise.  6. Otalgia of right ear  7. Depression, major, recurrent, moderate (HCC) - FLUoxetine (PROZAC) 40 MG capsule; Take 1 capsule (40 mg total) by mouth daily.  Dispense: 90 capsule; Refill: 0   8. Lumbar Back Pain The current medical regimen is effective;  continue present plan and medications.   Meds ordered this encounter  Medications   FLUoxetine (PROZAC) 40 MG capsule    Sig: Take 1 capsule (40 mg total) by mouth daily.    Dispense:  90 capsule    Refill:  0   Semaglutide, 2 MG/DOSE, 8 MG/3ML SOPN    Sig: Inject 2 mg into the skin once a week.    Dispense:  3 mL    Refill:  3    Orders Placed This Encounter  Procedures   CBC with Differential/Platelet   Comprehensive metabolic panel   Hemoglobin A1c   Lipid panel   Cardiovascular Risk Assessment   POCT UA - Microalbumin     Follow-up: Return in about 3 months (around 08/26/2021) for fasting.  An After Visit Summary was printed and given to the patient.  Blane Ohara, MD [facility] [phone]

## 2021-05-27 LAB — COMPREHENSIVE METABOLIC PANEL
ALT: 25 IU/L (ref 0–44)
AST: 15 IU/L (ref 0–40)
Albumin/Globulin Ratio: 1.8 (ref 1.2–2.2)
Albumin: 4.3 g/dL (ref 3.8–4.9)
Alkaline Phosphatase: 53 IU/L (ref 44–121)
BUN/Creatinine Ratio: 18 (ref 9–20)
BUN: 19 mg/dL (ref 6–24)
Bilirubin Total: <0.2 mg/dL (ref 0.0–1.2)
CO2: 21 mmol/L (ref 20–29)
Calcium: 11.2 mg/dL — ABNORMAL HIGH (ref 8.7–10.2)
Chloride: 104 mmol/L (ref 96–106)
Creatinine, Ser: 1.05 mg/dL (ref 0.76–1.27)
Globulin, Total: 2.4 g/dL (ref 1.5–4.5)
Glucose: 148 mg/dL — ABNORMAL HIGH (ref 65–99)
Potassium: 5.1 mmol/L (ref 3.5–5.2)
Sodium: 141 mmol/L (ref 134–144)
Total Protein: 6.7 g/dL (ref 6.0–8.5)
eGFR: 82 mL/min/{1.73_m2} (ref >59)

## 2021-05-27 LAB — CARDIOVASCULAR RISK ASSESSMENT

## 2021-05-27 LAB — CBC WITH DIFFERENTIAL/PLATELET
Basophils Absolute: 0.1 10*3/uL (ref 0.0–0.2)
Basos: 1 %
EOS (ABSOLUTE): 0.3 10*3/uL (ref 0.0–0.4)
Eos: 4 %
Hematocrit: 46.2 % (ref 37.5–51.0)
Hemoglobin: 15 g/dL (ref 13.0–17.7)
Immature Grans (Abs): 0 10*3/uL (ref 0.0–0.1)
Immature Granulocytes: 0 %
Lymphocytes Absolute: 1.5 10*3/uL (ref 0.7–3.1)
Lymphs: 21 %
MCH: 29.7 pg (ref 26.6–33.0)
MCHC: 32.5 g/dL (ref 31.5–35.7)
MCV: 92 fL (ref 79–97)
Monocytes Absolute: 0.6 10*3/uL (ref 0.1–0.9)
Monocytes: 9 %
Neutrophils Absolute: 4.6 10*3/uL (ref 1.4–7.0)
Neutrophils: 65 %
Platelets: 292 10*3/uL (ref 150–450)
RBC: 5.05 x10E6/uL (ref 4.14–5.80)
RDW: 14.6 % (ref 11.6–15.4)
WBC: 7.1 10*3/uL (ref 3.4–10.8)

## 2021-05-27 LAB — HEMOGLOBIN A1C
Est. average glucose Bld gHb Est-mCnc: 148 mg/dL
Hgb A1c MFr Bld: 6.8 % — ABNORMAL HIGH (ref 4.8–5.6)

## 2021-05-27 LAB — LIPID PANEL
Chol/HDL Ratio: 2.6 ratio (ref 0.0–5.0)
Cholesterol, Total: 137 mg/dL (ref 100–199)
HDL: 52 mg/dL (ref >39)
LDL Chol Calc (NIH): 63 mg/dL (ref 0–99)
Triglycerides: 128 mg/dL (ref 0–149)
VLDL Cholesterol Cal: 22 mg/dL (ref 5–40)

## 2021-06-01 ENCOUNTER — Encounter: Payer: 59 | Admitting: Family Medicine

## 2021-06-11 ENCOUNTER — Encounter: Payer: Self-pay | Admitting: Family Medicine

## 2021-06-24 ENCOUNTER — Other Ambulatory Visit: Payer: Self-pay | Admitting: Family Medicine

## 2021-07-22 ENCOUNTER — Other Ambulatory Visit: Payer: Self-pay

## 2021-07-22 ENCOUNTER — Ambulatory Visit: Payer: 59 | Admitting: Family Medicine

## 2021-07-22 DIAGNOSIS — M1711 Unilateral primary osteoarthritis, right knee: Secondary | ICD-10-CM | POA: Diagnosis not present

## 2021-07-22 DIAGNOSIS — M1712 Unilateral primary osteoarthritis, left knee: Secondary | ICD-10-CM | POA: Diagnosis not present

## 2021-07-22 DIAGNOSIS — E1142 Type 2 diabetes mellitus with diabetic polyneuropathy: Secondary | ICD-10-CM | POA: Diagnosis not present

## 2021-07-22 MED ORDER — TRIAMCINOLONE ACETONIDE 40 MG/ML IJ SUSP: 80.0000 mg | Freq: Once | INTRAMUSCULAR | Status: AC

## 2021-07-22 MED ORDER — TRIAMCINOLONE ACETONIDE 40 MG/ML IJ SUSP
80.0000 mg | Freq: Once | INTRAMUSCULAR | Status: AC
Start: 2021-07-22 — End: ?

## 2021-07-22 MED ORDER — PANTOPRAZOLE SODIUM 40 MG PO TBEC: 40.0000 mg | DELAYED_RELEASE_TABLET | Freq: Two times a day (BID) | ORAL | 1 refills | Status: AC

## 2021-07-22 NOTE — Progress Notes (Signed)
Acute Office Visit  Subjective:    Patient ID: Russell Klein, male    DOB: [birth year], 60 y.o.   MRN: [id number]  Chief Complaint  Patient presents with   Knee Pain    Bilateral     HPI Patient is in today for BL knee injections. Pt has BL OA of knees and is unable to undergo TKRs until he loses more weight. He is unable to work currently. He is very frustrated and in a lot of pain. He has done very well on ozempic 2 mg once weekly. He has lost 22 lbs in the last year.   Past Medical History:  Diagnosis Date   Back pain    Chronic pain syndrome    Diabetes (HCC)    History of stomach ulcers    Hyperlipidemia    Hyperparathyroidism (HCC)    Hypertension    Primary insomnia    Pulmonary embolism (HCC)    Sleep apnea    Thyroid disease     Past Surgical History:  Procedure Laterality Date   APPENDECTOMY     Exploratory Laporatomy     LEFT HEART CATH AND CORONARY ANGIOGRAPHY N/A 09/18/2018   Procedure: LEFT HEART CATH AND CORONARY ANGIOGRAPHY;  Surgeon: Marykay Lex, MD;  Location: [facility] INVASIVE CV LAB;  Service: Cardiovascular;  Laterality: N/A;   PARATHYROIDECTOMY      Family History  Problem Relation Age of Onset   Stroke Mother    Diabetes Mother    Cushing syndrome Father    Thyroid disease Sister     Social History   Socioeconomic History   Marital status: Single    Spouse name: Not on file   Number of children: 2   Years of education: Not on file   Highest education level: Not on file  Occupational History    Comment: [employer]  Tobacco Use   Smoking status: Former    Types: Cigars    Quit date: 2013    Years since quitting: 9.6   Smokeless tobacco: Never  Vaping Use   Vaping Use: Never used  Substance and Sexual Activity   Alcohol use: Not Currently   Drug use: Never   Sexual activity: Yes    Partners: Female  Other Topics Concern   Not on file  Social History Narrative   Not on file   Social Determinants of Health    Financial Resource Strain: Not on file  Food Insecurity: Not on file  Transportation Needs: Not on file  Physical Activity: Not on file  Stress: Not on file  Social Connections: Not on file  Intimate Partner Violence: Not on file    Outpatient Medications Prior to Visit  Medication Sig Dispense Refill   celecoxib (CELEBREX) 100 MG capsule Take 100 mg by mouth daily.     aspirin EC 81 MG tablet Take 81 mg by mouth daily.     atorvastatin (LIPITOR) 20 MG tablet Take 1 tablet by mouth once daily 90 tablet 0   Cholecalciferol (VITAMIN D3) 5000 units TABS Take 1 tablet by mouth daily.     diclofenac Sodium (VOLTAREN) 1 % GEL Apply 4 grams to R knee 2 to 3 times a day prn pain     FARXIGA 10 MG TABS tablet TAKE 1 TABLET BY MOUTH ONCE DAILY BEFORE BREAKFAST 90 tablet 0   FLUoxetine (PROZAC) 40 MG capsule Take 1 capsule (40 mg total) by mouth daily. 90 capsule 0   fluticasone (FLONASE) 50 MCG/ACT  nasal spray Place into the nose.     lisinopril (ZESTRIL) 20 MG tablet Take 1 tablet by mouth once daily 90 tablet 0   loratadine (CLARITIN) 10 MG tablet Take 1 tablet (10 mg total) by mouth daily. 90 tablet 1   metFORMIN (GLUCOPHAGE) 1000 MG tablet Take 1 tablet (1,000 mg total) by mouth 2 (two) times daily with a meal. 180 tablet 3   methocarbamol (ROBAXIN) 750 MG tablet Take 1 tablet (750 mg total) by mouth 4 (four) times daily. 120 tablet 0   metoprolol succinate (TOPROL-XL) 25 MG 24 hr tablet Take 25 mg by mouth daily.  0   montelukast (SINGULAIR) 10 MG tablet Take 1 tablet (10 mg total) by mouth at bedtime. 90 tablet 0   nitroGLYCERIN (NITROSTAT) 0.4 MG SL tablet Place 0.4 mg under the tongue every 5 (five) minutes as needed for chest pain.   1   pioglitazone (ACTOS) 15 MG tablet Take 1 tablet by mouth once daily 90 tablet 0   Semaglutide, 2 MG/DOSE, 8 MG/3ML SOPN Inject 2 mg into the skin once a week. 3 mL 3   traMADol-acetaminophen (ULTRACET) 37.5-325 MG tablet Take 2 tablets by mouth every 6  (six) hours as needed. 48 tablet 0   traZODone (DESYREL) 50 MG tablet Take 1 tablet (50 mg total) by mouth at bedtime. 90 tablet 0   triamcinolone acetonide (TRIESENCE) 40 MG/ML SUSP Inject into the articular space.     meloxicam (MOBIC) 15 MG tablet Take 1 tablet (15 mg total) by mouth daily. 30 tablet 2   pantoprazole (PROTONIX) 40 MG tablet Take 1 tablet by mouth once daily 90 tablet 3   No facility-administered medications prior to visit.    Allergies  Allergen Reactions   Invokamet [Canagliflozin-Metformin Hcl] Nausea Only    Review of Systems  Constitutional:  Negative for activity change, chills and fever.  HENT:  Negative for congestion, ear pain and sore throat.   Respiratory:  Positive for shortness of breath (with exertion). Negative for cough.   Cardiovascular:  Negative for chest pain.  Gastrointestinal:  Positive for nausea. Negative for abdominal pain, constipation and vomiting.  Musculoskeletal:  Positive for arthralgias, joint swelling and myalgias.      Objective:    Physical Exam Vitals reviewed.  Constitutional:      Appearance: Normal appearance. He is obese.  Musculoskeletal:        General: Swelling (BL knees) and tenderness (BL KNEES) present.  Neurological:     Mental Status: He is alert and oriented to person, place, and time.     Gait: Gait abnormal.  Psychiatric:        Mood and Affect: Mood normal.        Behavior: Behavior normal.    There were no vitals taken for this visit. Wt Readings from Last 3 Encounters:  05/26/21 278 lb (126.1 kg)  03/18/21 280 lb (127 kg)  02/22/21 280 lb (127 kg)    Health Maintenance Due  Topic Date Due   HIV Screening  Never done   Hepatitis C Screening  Never done   Zoster Vaccines- Shingrix (2 of 2) 01/28/2020   COVID-19 Vaccine (3 - Booster for Pfizer series) 08/16/2020   INFLUENZA VACCINE  06/21/2021    There are no preventive care reminders to display for this patient.   Lab Results  Component  Value Date   TSH 1.620 02/08/2021   Lab Results  Component Value Date   WBC 7.1 05/26/2021  HGB 15.0 05/26/2021   HCT 46.2 05/26/2021   MCV 92 05/26/2021   PLT 292 05/26/2021   Lab Results  Component Value Date   NA 141 05/26/2021   K 5.1 05/26/2021   CO2 21 05/26/2021   GLUCOSE 148 (H) 05/26/2021   BUN 19 05/26/2021   CREATININE 1.05 05/26/2021   BILITOT <0.2 05/26/2021   ALKPHOS 53 05/26/2021   AST 15 05/26/2021   ALT 25 05/26/2021   PROT 6.7 05/26/2021   ALBUMIN 4.3 05/26/2021   CALCIUM 11.2 (H) 05/26/2021   EGFR 82 05/26/2021   Lab Results  Component Value Date   CHOL 137 05/26/2021   Lab Results  Component Value Date   HDL 52 05/26/2021   Lab Results  Component Value Date   LDLCALC 63 05/26/2021   Lab Results  Component Value Date   TRIG 128 05/26/2021   Lab Results  Component Value Date   CHOLHDL 2.6 05/26/2021   Lab Results  Component Value Date   HGBA1C 6.8 (H) 05/26/2021       Assessment & Plan:  1. Primary osteoarthritis of right knee - triamcinolone acetonide (KENALOG-40) injection 80 mg  After consent was obtained, using sterile technique the right knee was prepped with alcohol and ethyl chloride. Kenalog 80 mg and 5 ml plain Lidocaine was then injected and the needle withdrawn.  The procedure was well tolerated.  The patient is asked to continue to rest the joint for a few more days before resuming regular activities.  It may be more painful for the first 1-2 days.  Watch for fever, or increased swelling or persistent pain in the joint. Call or return to clinic prn if such symptoms occur or there is failure to improve as anticipated.  2. Primary osteoarthritis of left knee - triamcinolone acetonide (KENALOG-40) injection 80 mg  After consent was obtained, using sterile technique the left knee was prepped with alcohol and anesthesia was ethyl chloride. Kenalog 80 mg and 5 ml plain Lidocaine was then injected and the needle withdrawn.  The  procedure was well tolerated.  The patient is asked to continue to rest the joint for a few more days before resuming regular activities.  It may be more painful for the first 1-2 days.  Watch for fever, or increased swelling or persistent pain in the joint. Call or return to clinic prn if such symptoms occur or there is failure to improve as anticipated.   3. Morbid obesity Starting mounjora. Hopefully this will help even more with weight loss.   4. Diabetes complicated by neuropathy.  Stop ozempic (his last dose was last Wednesday.) Start mounjora 0.25 mg once weekly.  I explained he may have an increase in his sugars both from the above steroid shots and then when he starts the mounjora next Wednesday.   Meds ordered this encounter  Medications   triamcinolone acetonide (KENALOG-40) injection 80 mg   triamcinolone acetonide (KENALOG-40) injection 80 mg   pantoprazole (PROTONIX) 40 MG tablet    Sig: Take 1 tablet (40 mg total) by mouth 2 (two) times daily.    Dispense:  180 tablet    Refill:  1   Follow-up: prn  An After Visit Summary was printed and given to the patient.  Rochel Brome, MD [facility] [phone]

## 2021-07-26 ENCOUNTER — Encounter: Payer: Self-pay | Admitting: Family Medicine

## 2021-07-26 MED ORDER — TIRZEPATIDE 2.5 MG/0.5ML ~~LOC~~ SOAJ: 2.5000 mg | SUBCUTANEOUS | 0 refills | Status: DC

## 2021-08-05 ENCOUNTER — Other Ambulatory Visit: Payer: Self-pay

## 2021-08-05 ENCOUNTER — Encounter: Payer: Self-pay | Admitting: Family Medicine

## 2021-08-05 ENCOUNTER — Ambulatory Visit (INDEPENDENT_AMBULATORY_CARE_PROVIDER_SITE_OTHER): Payer: 59 | Admitting: Family Medicine

## 2021-08-05 VITALS — BP 108/70 | HR 108 | Temp 97.3°F | Resp 18 | Ht 69.0 in | Wt 269.0 lb

## 2021-08-05 DIAGNOSIS — E291 Testicular hypofunction: Secondary | ICD-10-CM

## 2021-08-05 DIAGNOSIS — E1121 Type 2 diabetes mellitus with diabetic nephropathy: Secondary | ICD-10-CM | POA: Diagnosis not present

## 2021-08-05 MED ORDER — SILDENAFIL CITRATE 100 MG PO TABS: ORAL_TABLET | ORAL | 0 refills | Status: DC

## 2021-08-05 NOTE — Progress Notes (Addendum)
Subjective:  Patient ID: Russell Klein, male    DOB: 02-10-1961  Age: 60 y.o. MRN: [id number]  Chief Complaint  Patient presents with   Diabetes    HPI Patient is a 60 year old white male with diabetes who presents for follow-up he did his wife for erectile dysfunction.  He currently is unable to get an erection but does have a sex drive.  Patient received arthrocentesis of knees 2 weeks ago.  His knees have improved.  He is working on losing weight in order to get total knee replacements.  The patient discontinued metformin on his own and he is doing much better he has been having GI discomfort and diarrhea for months. This fully resolved with discontinuation of metformin.  He started mounjaro and is tolerating it. He began losing weight after coming off metformin prior to starting mounjaro.   Current Outpatient Medications on File Prior to Visit  Medication Sig Dispense Refill   aspirin EC 81 MG tablet Take 81 mg by mouth daily.     atorvastatin (LIPITOR) 20 MG tablet Take 1 tablet by mouth once daily 90 tablet 0   celecoxib (CELEBREX) 100 MG capsule Take 100 mg by mouth daily.     Cholecalciferol (VITAMIN D3) 5000 units TABS Take 1 tablet by mouth daily.     diclofenac Sodium (VOLTAREN) 1 % GEL Apply 4 grams to R knee 2 to 3 times a day prn pain     FLUoxetine (PROZAC) 40 MG capsule Take 1 capsule (40 mg total) by mouth daily. 90 capsule 0   fluticasone (FLONASE) 50 MCG/ACT nasal spray Place into the nose.     loratadine (CLARITIN) 10 MG tablet Take 1 tablet (10 mg total) by mouth daily. 90 tablet 1   methocarbamol (ROBAXIN) 750 MG tablet Take 1 tablet (750 mg total) by mouth 4 (four) times daily. 120 tablet 0   metoprolol succinate (TOPROL-XL) 25 MG 24 hr tablet Take 25 mg by mouth daily.  0   montelukast (SINGULAIR) 10 MG tablet Take 1 tablet (10 mg total) by mouth at bedtime. 90 tablet 0   pantoprazole (PROTONIX) 40 MG tablet Take 1 tablet (40 mg total) by mouth 2 (two) times  daily. 180 tablet 1   pioglitazone (ACTOS) 15 MG tablet Take 1 tablet by mouth once daily 90 tablet 0   traMADol-acetaminophen (ULTRACET) 37.5-325 MG tablet Take 2 tablets by mouth every 6 (six) hours as needed. 48 tablet 0   traZODone (DESYREL) 50 MG tablet Take 1 tablet (50 mg total) by mouth at bedtime. 90 tablet 0   triamcinolone acetonide (TRIESENCE) 40 MG/ML SUSP Inject into the articular space.     Current Facility-Administered Medications on File Prior to Visit  Medication Dose Route Frequency Provider Last Rate Last Admin   triamcinolone acetonide (KENALOG-40) injection 80 mg  80 mg Intra-articular Once Kennon Encinas, MD       triamcinolone acetonide (KENALOG-40) injection 80 mg  80 mg Intra-articular Once Donella Pascarella, Fritzi Mandes, MD       Past Medical History:  Diagnosis Date   Back pain    Chronic pain syndrome    Diabetes (HCC)    History of stomach ulcers    Hyperlipidemia    Hyperparathyroidism (HCC)    Hypertension    Primary insomnia    Pulmonary embolism (HCC)    Sleep apnea    Thyroid disease    Past Surgical History:  Procedure Laterality Date   APPENDECTOMY     Exploratory Laporatomy  LEFT HEART CATH AND CORONARY ANGIOGRAPHY N/A 09/18/2018   Procedure: LEFT HEART CATH AND CORONARY ANGIOGRAPHY;  Surgeon: Marykay Lex, MD;  Location: [facility] INVASIVE CV LAB;  Service: Cardiovascular;  Laterality: N/A;   PARATHYROIDECTOMY      Family History  Problem Relation Age of Onset   Stroke Mother    Diabetes Mother    Cushing syndrome Father    Thyroid disease Sister    Social History   Socioeconomic History   Marital status: Single    Spouse name: Not on file   Number of children: 2   Years of education: Not on file   Highest education level: Not on file  Occupational History    Comment: [employer]  Tobacco Use   Smoking status: Former    Types: Cigars    Quit date: 2013    Years since quitting: 9.7   Smokeless tobacco: Never  Vaping Use   Vaping  Use: Never used  Substance and Sexual Activity   Alcohol use: Not Currently   Drug use: Never   Sexual activity: Yes    Partners: Female  Other Topics Concern   Not on file  Social History Narrative   Not on file   Social Determinants of Health   Financial Resource Strain: Not on file  Food Insecurity: Not on file  Transportation Needs: Not on file  Physical Activity: Not on file  Stress: Not on file  Social Connections: Not on file    Review of Systems  Constitutional:  Negative for chills, fatigue and fever.  HENT:  Negative for congestion, rhinorrhea and sore throat.   Respiratory:  Negative for cough and shortness of breath.   Cardiovascular:  Negative for chest pain and palpitations.  Gastrointestinal:  Negative for abdominal pain, constipation, diarrhea, nausea and vomiting.  Genitourinary:  Negative for dysuria and urgency.       Erectile dysfunction   Musculoskeletal:  Positive for arthralgias (bilateral knee pain). Negative for back pain and myalgias.  Neurological:  Negative for dizziness and headaches.  Psychiatric/Behavioral:  Negative for dysphoric mood. The patient is not nervous/anxious.     Objective:  BP 108/70   Pulse (!) 108   Temp (!) 97.3 F (36.3 C)   Resp 18   Ht 5\' 9"  (1.753 m)   Wt 269 lb (122 kg)   BMI 39.72 kg/m   BP/Weight 08/05/2021 05/26/2021 03/18/2021  Systolic BP 108 120 142  Diastolic BP 70 64 85  Wt. (Lbs) 269 278 280  BMI 39.72 41.05 41.35    Physical Exam Vitals reviewed.  Constitutional:      Appearance: Normal appearance. He is obese.  Cardiovascular:     Rate and Rhythm: Normal rate and regular rhythm.     Heart sounds: Normal heart sounds.  Pulmonary:     Effort: Pulmonary effort is normal.     Breath sounds: Normal breath sounds.  Neurological:     Mental Status: He is alert.    Diabetic Foot Exam - Simple   No data filed      Lab Results  Component Value Date   WBC 7.1 05/26/2021   HGB 15.0 05/26/2021    HCT 46.2 05/26/2021   PLT 292 05/26/2021   GLUCOSE 148 (H) 05/26/2021   CHOL 137 05/26/2021   TRIG 128 05/26/2021   HDL 52 05/26/2021   LDLCALC 63 05/26/2021   ALT 25 05/26/2021   AST 15 05/26/2021   NA 141 05/26/2021   K  5.1 05/26/2021   CL 104 05/26/2021   CREATININE 1.05 05/26/2021   BUN 19 05/26/2021   CO2 21 05/26/2021   TSH 1.620 02/08/2021   PSA 0.6 12/03/2019   HGBA1C 6.8 (H) 05/26/2021   MICROALBUR 10 05/26/2021      Assessment & Plan:   Problem List Items Addressed This Visit       Endocrine   Diabetic glomerulopathy (HCC)    Continue mounjaro. Metformin added to allergy list.       Hypogonadism in male - Primary    Check testosterone levels. Came back low. Recommended testosterone shots 200 mg every otherweek rx for viagra given.       Relevant Orders   Testosterone,Free and Total (Completed)  .  Meds ordered this encounter  Medications   DISCONTD: sildenafil (VIAGRA) 100 MG tablet    Sig: 1/2-1 pill one hour prior to intercourse.    Dispense:  10 tablet    Refill:  0    Orders Placed This Encounter  Procedures   Testosterone,Free and Total     Follow-up: No follow-ups on file.  An After Visit Summary was printed and given to the patient.  Blane Ohara, MD [facility] [phone]

## 2021-08-09 LAB — TESTOSTERONE,FREE AND TOTAL
Testosterone, Free: 4 pg/mL — ABNORMAL LOW (ref 7.2–24.0)
Testosterone: 259 ng/dL — ABNORMAL LOW (ref 264–916)

## 2021-08-11 DIAGNOSIS — E291 Testicular hypofunction: Secondary | ICD-10-CM | POA: Insufficient documentation

## 2021-08-11 NOTE — Assessment & Plan Note (Signed)
Continue mounjaro. Metformin added to allergy list.

## 2021-08-11 NOTE — Assessment & Plan Note (Addendum)
Check testosterone levels. Came back low. Recommended testosterone shots 200 mg every otherweek rx for viagra given.

## 2021-08-12 ENCOUNTER — Other Ambulatory Visit: Payer: Self-pay

## 2021-08-12 MED ORDER — TESTOSTERONE CYPIONATE 200 MG/ML IM SOLN
200.0000 mg | INTRAMUSCULAR | 1 refills | Status: AC
Start: 2021-08-12 — End: ?

## 2021-08-13 ENCOUNTER — Other Ambulatory Visit: Payer: Self-pay | Admitting: Physician Assistant

## 2021-08-16 ENCOUNTER — Other Ambulatory Visit: Payer: Self-pay | Admitting: Physician Assistant

## 2021-08-17 ENCOUNTER — Other Ambulatory Visit: Payer: Self-pay

## 2021-08-17 ENCOUNTER — Ambulatory Visit (INDEPENDENT_AMBULATORY_CARE_PROVIDER_SITE_OTHER): Payer: 59

## 2021-08-17 DIAGNOSIS — E291 Testicular hypofunction: Secondary | ICD-10-CM

## 2021-08-17 MED ORDER — TESTOSTERONE CYPIONATE 200 MG/ML IM SOLN
200.0000 mg | INTRAMUSCULAR | Status: AC
  Administered 2021-08-17 – 2021-11-09 (×7): 200 mg via INTRAMUSCULAR

## 2021-08-21 ENCOUNTER — Other Ambulatory Visit: Payer: Self-pay | Admitting: Family Medicine

## 2021-08-25 ENCOUNTER — Other Ambulatory Visit: Payer: Self-pay | Admitting: Family Medicine

## 2021-08-25 MED ORDER — TIRZEPATIDE 5 MG/0.5ML ~~LOC~~ SOAJ: 5.0000 mg | SUBCUTANEOUS | 1 refills | Status: DC

## 2021-08-25 NOTE — Progress Notes (Signed)
Sent mounjaro 5 mg once weekly. Dr. Sedalia Muta

## 2021-08-30 NOTE — Progress Notes (Signed)
Subjective:  Patient ID: Russell Klein, male    DOB: [birth year]  Age: 60 y.o. MRN: [id number]  Chief Complaint  Patient presents with   Diabetes   Hypertension   Hyperlipidemia    HPI:  Mixed hyperlipidemia Patient is currently Taking lipitor 20 mg 1 tablet once daily. Tries to eat healthy, and is very active.    Diabetic polyneuropathy associated with type 2 diabetes mellitus (HCC) Patient is controlled with farxiga 10 mg daily, Mounjaro 5 mg once weekly, actos 15 mg daily. Patient checks his sugars daily. Patient states that his highest sugar reading being 130 -140s. Checking feet daily.    Essential hypertension Patient currently takes lisinopril 20 mg 1 tablet daily and metoprolol 25 mg 1 tablet daily   GERD Currently doing well on Protonix 40 mg 1 tablet daily.  OA Knee- Patient uses voltaren gel. Taking aleve 6-8 daily.    Insomnia Patient takes Trazodone 50 mg 1 tablet at bedtime.    Current Outpatient Medications on File Prior to Visit  Medication Sig Dispense Refill   aspirin EC 81 MG tablet Take 81 mg by mouth daily.     atorvastatin (LIPITOR) 20 MG tablet Take 1 tablet by mouth once daily 90 tablet 0   celecoxib (CELEBREX) 200 MG capsule Take 200 mg by mouth daily.     Cholecalciferol (VITAMIN D3) 5000 units TABS Take 1 tablet by mouth daily.     diclofenac Sodium (VOLTAREN) 1 % GEL Apply 4 grams to R knee 2 to 3 times a day prn pain     FARXIGA 10 MG TABS tablet TAKE 1 TABLET BY MOUTH ONCE DAILY BEFORE BREAKFAST 90 tablet 0   fluticasone (FLONASE) 50 MCG/ACT nasal spray Place into the nose.     lisinopril (ZESTRIL) 20 MG tablet Take 1 tablet by mouth once daily 90 tablet 0   loratadine (CLARITIN) 10 MG tablet Take 1 tablet (10 mg total) by mouth daily. 90 tablet 1   montelukast (SINGULAIR) 10 MG tablet Take 1 tablet (10 mg total) by mouth at bedtime. 90 tablet 0   pantoprazole (PROTONIX) 40 MG tablet Take 1 tablet (40 mg total) by mouth 2 (two) times daily. 180  tablet 1   testosterone cypionate (DEPOTESTOSTERONE CYPIONATE) 200 MG/ML injection Inject 1 mL (200 mg total) into the muscle every 14 (fourteen) days. 6 mL 1   tirzepatide (MOUNJARO) 5 MG/0.5ML Pen Inject 5 mg into the skin once a week. 2 mL 1   traZODone (DESYREL) 50 MG tablet Take 1 tablet (50 mg total) by mouth at bedtime. 90 tablet 0   triamcinolone acetonide (TRIESENCE) 40 MG/ML SUSP Inject into the articular space.     Current Facility-Administered Medications on File Prior to Visit  Medication Dose Route Frequency Provider Last Rate Last Admin   testosterone cypionate (DEPOTESTOSTERONE CYPIONATE) injection 200 mg  200 mg Intramuscular Q14 Days Ajane Novella, MD   200 mg at 09/14/21 1111   triamcinolone acetonide (KENALOG-40) injection 80 mg  80 mg Intra-articular Once Nathin Saran, MD       triamcinolone acetonide (KENALOG-40) injection 80 mg  80 mg Intra-articular Once Orva Gwaltney, Fritzi Mandes, MD       Past Medical History:  Diagnosis Date   Back pain    Chronic pain syndrome    Diabetes (HCC)    History of stomach ulcers    Hyperlipidemia    Hyperparathyroidism (HCC)    Hypertension    Primary insomnia    Pulmonary embolism (HCC)  Sinus tachycardia 01/31/2020   Sleep apnea    Thyroid disease    Past Surgical History:  Procedure Laterality Date   APPENDECTOMY     Exploratory Laporatomy     LEFT HEART CATH AND CORONARY ANGIOGRAPHY N/A 09/18/2018   Procedure: LEFT HEART CATH AND CORONARY ANGIOGRAPHY;  Surgeon: Marykay Lex, MD;  Location: [facility] INVASIVE CV LAB;  Service: Cardiovascular;  Laterality: N/A;   PARATHYROIDECTOMY      Family History  Problem Relation Age of Onset   Stroke Mother    Diabetes Mother    Cushing syndrome Father    Thyroid disease Sister    Social History   Socioeconomic History   Marital status: Single    Spouse name: Not on file   Number of children: 2   Years of education: Not on file   Highest education level: Not on file  Occupational History     Comment: [employer]  Tobacco Use   Smoking status: Former    Types: Cigars    Quit date: 2013    Years since quitting: 9.8   Smokeless tobacco: Never  Vaping Use   Vaping Use: Never used  Substance and Sexual Activity   Alcohol use: Not Currently   Drug use: Never   Sexual activity: Yes    Partners: Female  Other Topics Concern   Not on file  Social History Narrative   Not on file   Social Determinants of Health   Financial Resource Strain: Not on file  Food Insecurity: Not on file  Transportation Needs: Not on file  Physical Activity: Not on file  Stress: Not on file  Social Connections: Not on file    Review of Systems  Constitutional:  Negative for appetite change, fatigue and fever.  HENT:  Negative for congestion, ear pain and sore throat.   Respiratory:  Negative for cough and shortness of breath.   Cardiovascular:  Negative for chest pain and leg swelling.  Gastrointestinal:  Negative for abdominal pain, constipation, diarrhea, nausea and vomiting.  Genitourinary:  Negative for dysuria and frequency.  Musculoskeletal:  Positive for back pain. Negative for arthralgias and myalgias.  Neurological:  Negative for dizziness and headaches.  Psychiatric/Behavioral:  Negative for dysphoric mood. The patient is not nervous/anxious.     Objective:  BP 106/72 (BP Location: Right Arm, Patient Position: Sitting, Cuff Size: Large)   Pulse 91   Temp (!) 97.3 F (36.3 C) (Temporal)   Ht 5\' 9"  (1.753 m)   Wt 275 lb 3.2 oz (124.8 kg)   SpO2 97%   BMI 40.64 kg/m   BP/Weight 08/31/2021 08/05/2021 05/26/2021  Systolic BP 106 108 120  Diastolic BP 72 70 64  Wt. (Lbs) 275.2 269 278  BMI 40.64 39.72 41.05    Physical Exam Vitals reviewed.  Constitutional:      Appearance: Normal appearance. He is obese.  Neck:     Vascular: No carotid bruit.  Cardiovascular:     Rate and Rhythm: Normal rate and regular rhythm.     Pulses: Normal pulses.     Heart  sounds: Normal heart sounds.  Pulmonary:     Effort: Pulmonary effort is normal.     Breath sounds: Normal breath sounds. No wheezing, rhonchi or rales.  Abdominal:     General: Bowel sounds are normal.     Palpations: Abdomen is soft.     Tenderness: There is no abdominal tenderness.  Musculoskeletal:        General:  Tenderness (BL knees) present.  Neurological:     Mental Status: He is alert and oriented to person, place, and time.  Psychiatric:        Mood and Affect: Mood normal.        Behavior: Behavior normal.    Diabetic Foot Exam - Simple   Simple Foot Form Diabetic Foot exam was performed with the following findings: Yes 08/31/2021  8:31 AM  Visual Inspection No deformities, no ulcerations, no other skin breakdown bilaterally: Yes Sensation Testing Intact to touch and monofilament testing bilaterally: Yes Pulse Check Posterior Tibialis and Dorsalis pulse intact bilaterally: Yes Comments      Lab Results  Component Value Date   WBC 8.6 08/31/2021   HGB 14.9 08/31/2021   HCT 44.6 08/31/2021   PLT 289 08/31/2021   GLUCOSE 144 (H) 08/31/2021   CHOL 139 08/31/2021   TRIG 70 08/31/2021   HDL 56 08/31/2021   LDLCALC 69 08/31/2021   ALT 25 08/31/2021   AST 14 08/31/2021   NA 140 08/31/2021   K 4.6 08/31/2021   CL 103 08/31/2021   CREATININE 1.00 08/31/2021   BUN 15 08/31/2021   CO2 25 08/31/2021   TSH 1.620 02/08/2021   PSA 0.6 12/03/2019   HGBA1C 6.9 (H) 08/31/2021   MICROALBUR 10 05/26/2021      Assessment & Plan:   Problem List Items Addressed This Visit       Cardiovascular and Mediastinum   Hypertension associated with diabetes (HCC)    The current medical regimen is effective;  continue present plan and medications. Continue lisinopril 20 mg 1 tablet daily and metoprolol 25 mg 1 tablet daily      Relevant Orders   CBC with Differential/Platelet (Completed)   Comprehensive metabolic panel (Completed)     Endocrine   Diabetic  polyneuropathy associated with type 2 diabetes mellitus (HCC) - Primary    Increase mounjaro to 7.5 mg weekly upon completion of 5 mg weekly.      Relevant Medications   FLUoxetine (PROZAC) 40 MG capsule   Other Relevant Orders   Hemoglobin A1c (Completed)   Diabetic glomerulopathy (HCC)    Recommend stop actos upon completion.  Increase mounjaro to 7.5 mg weekly upon completion of 5 mg weekly.  Recommend continue to work on eating healthy diet. Continue lisinopril 20 mg once daily.        Musculoskeletal and Integument   Primary osteoarthritis of both knees    Work aggressively on weight loss.  Needs TKR.      Relevant Medications   celecoxib (CELEBREX) 200 MG capsule     Other   Morbid obesity with BMI of 40.0-44.9, adult (HCC)    Healthy diet.  Unable to exercise.  Recommend adjust mounjaro as above.       Mixed hyperlipidemia    At goal.  Continue lipitor 20 mg 1 tablet once daily. Recommend continue to work on eating healthy diet. Unable to exercise currently.      Relevant Orders   Lipid panel (Completed)   Depression, major, recurrent, moderate (HCC)    Continue prozac.       Relevant Medications   FLUoxetine (PROZAC) 40 MG capsule   Other Visit Diagnoses     Encounter for immunization       Relevant Orders   Flu Vaccine MDCK QUAD PF (Completed)     .  Meds ordered this encounter  Medications   DISCONTD: traMADol-acetaminophen (ULTRACET) 37.5-325 MG tablet  Sig: Take 2 tablets by mouth every 8 (eight) hours as needed for severe pain.    Dispense:  30 tablet    Refill:  0   FLUoxetine (PROZAC) 40 MG capsule    Sig: Take 1 capsule (40 mg total) by mouth daily.    Dispense:  90 capsule    Refill:  3     Orders Placed This Encounter  Procedures   Flu Vaccine MDCK QUAD PF   CBC with Differential/Platelet   Comprehensive metabolic panel   Lipid panel   Hemoglobin A1c   Cardiovascular Risk Assessment      Follow-up: Return in about 3  months (around 12/01/2021) for chronic fasting.  An After Visit Summary was printed and given to the patient.    I,Lauren M Auman,acting as a scribe for Blane Ohara, MD.,have documented all relevant documentation on the behalf of Blane Ohara, MD,as directed by  Blane Ohara, MD while in the presence of Blane Ohara, MD.    Blane Ohara, MD [facility] [phone]

## 2021-08-31 ENCOUNTER — Ambulatory Visit: Payer: 59 | Admitting: Family Medicine

## 2021-08-31 ENCOUNTER — Other Ambulatory Visit: Payer: Self-pay

## 2021-08-31 ENCOUNTER — Encounter: Payer: Self-pay | Admitting: Family Medicine

## 2021-08-31 VITALS — BP 106/72 | HR 91 | Temp 97.3°F | Ht 69.0 in | Wt 275.2 lb

## 2021-08-31 DIAGNOSIS — E1121 Type 2 diabetes mellitus with diabetic nephropathy: Secondary | ICD-10-CM | POA: Diagnosis not present

## 2021-08-31 DIAGNOSIS — E291 Testicular hypofunction: Secondary | ICD-10-CM

## 2021-08-31 DIAGNOSIS — E782 Mixed hyperlipidemia: Secondary | ICD-10-CM | POA: Diagnosis not present

## 2021-08-31 DIAGNOSIS — F331 Major depressive disorder, recurrent, moderate: Secondary | ICD-10-CM

## 2021-08-31 DIAGNOSIS — M170 Bilateral primary osteoarthritis of knee: Secondary | ICD-10-CM

## 2021-08-31 DIAGNOSIS — Z23 Encounter for immunization: Secondary | ICD-10-CM

## 2021-08-31 DIAGNOSIS — I10 Essential (primary) hypertension: Secondary | ICD-10-CM

## 2021-08-31 DIAGNOSIS — E1159 Type 2 diabetes mellitus with other circulatory complications: Secondary | ICD-10-CM

## 2021-08-31 DIAGNOSIS — Z6841 Body Mass Index (BMI) 40.0 and over, adult: Secondary | ICD-10-CM

## 2021-08-31 DIAGNOSIS — E1142 Type 2 diabetes mellitus with diabetic polyneuropathy: Secondary | ICD-10-CM | POA: Diagnosis not present

## 2021-08-31 DIAGNOSIS — I152 Hypertension secondary to endocrine disorders: Secondary | ICD-10-CM

## 2021-08-31 MED ORDER — TRAMADOL-ACETAMINOPHEN 37.5-325 MG PO TABS: 2.0000 | ORAL_TABLET | Freq: Three times a day (TID) | ORAL | 0 refills | Status: DC | PRN

## 2021-08-31 NOTE — Patient Instructions (Addendum)
Recommend stop actos upon completion.  Increase mounjaro to 7.5 mg weekly upon completion of 5 mg weekly.  NO Aleve. Wait for labs and then I will consider starting an antiinflammatory medicine.  Start on ultracet 1-2 tablets every 8 hrs as needed for knee pain. CALL and schedule orthopedics follow up.

## 2021-08-31 NOTE — Assessment & Plan Note (Addendum)
Recommend stop actos upon completion.  Increase mounjaro to 7.5 mg weekly upon completion of 5 mg weekly.  Recommend continue to work on eating healthy diet. Continue lisinopril 20 mg once daily.

## 2021-08-31 NOTE — Assessment & Plan Note (Signed)
The current medical regimen is effective;  continue present plan and medications. Continue lisinopril 20 mg 1 tablet daily and metoprolol 25 mg 1 tablet daily

## 2021-09-01 LAB — CBC WITH DIFFERENTIAL/PLATELET
Basophils Absolute: 0 10*3/uL (ref 0.0–0.2)
Basos: 1 %
EOS (ABSOLUTE): 0.1 10*3/uL (ref 0.0–0.4)
Eos: 1 %
Hematocrit: 44.6 % (ref 37.5–51.0)
Hemoglobin: 14.9 g/dL (ref 13.0–17.7)
Immature Grans (Abs): 0.1 10*3/uL (ref 0.0–0.1)
Immature Granulocytes: 1 %
Lymphocytes Absolute: 1.6 10*3/uL (ref 0.7–3.1)
Lymphs: 19 %
MCH: 30.5 pg (ref 26.6–33.0)
MCHC: 33.4 g/dL (ref 31.5–35.7)
MCV: 91 fL (ref 79–97)
Monocytes Absolute: 0.7 10*3/uL (ref 0.1–0.9)
Monocytes: 9 %
Neutrophils Absolute: 6.1 10*3/uL (ref 1.4–7.0)
Neutrophils: 69 %
Platelets: 289 10*3/uL (ref 150–450)
RBC: 4.88 x10E6/uL (ref 4.14–5.80)
RDW: 14.1 % (ref 11.6–15.4)
WBC: 8.6 10*3/uL (ref 3.4–10.8)

## 2021-09-01 LAB — COMPREHENSIVE METABOLIC PANEL
ALT: 25 IU/L (ref 0–44)
AST: 14 IU/L (ref 0–40)
Albumin/Globulin Ratio: 2 (ref 1.2–2.2)
Albumin: 4.4 g/dL (ref 3.8–4.9)
Alkaline Phosphatase: 36 IU/L — ABNORMAL LOW (ref 44–121)
BUN/Creatinine Ratio: 15 (ref 9–20)
BUN: 15 mg/dL (ref 6–24)
Bilirubin Total: 0.3 mg/dL (ref 0.0–1.2)
CO2: 25 mmol/L (ref 20–29)
Calcium: 10.5 mg/dL — ABNORMAL HIGH (ref 8.7–10.2)
Chloride: 103 mmol/L (ref 96–106)
Creatinine, Ser: 1 mg/dL (ref 0.76–1.27)
Globulin, Total: 2.2 g/dL (ref 1.5–4.5)
Glucose: 144 mg/dL — ABNORMAL HIGH (ref 70–99)
Potassium: 4.6 mmol/L (ref 3.5–5.2)
Sodium: 140 mmol/L (ref 134–144)
Total Protein: 6.6 g/dL (ref 6.0–8.5)
eGFR: 87 mL/min/{1.73_m2} (ref >59)

## 2021-09-01 LAB — LIPID PANEL
Chol/HDL Ratio: 2.5 ratio (ref 0.0–5.0)
Cholesterol, Total: 139 mg/dL (ref 100–199)
HDL: 56 mg/dL (ref >39)
LDL Chol Calc (NIH): 69 mg/dL (ref 0–99)
Triglycerides: 70 mg/dL (ref 0–149)
VLDL Cholesterol Cal: 14 mg/dL (ref 5–40)

## 2021-09-01 LAB — HEMOGLOBIN A1C
Est. average glucose Bld gHb Est-mCnc: 151 mg/dL
Hgb A1c MFr Bld: 6.9 % — ABNORMAL HIGH (ref 4.8–5.6)

## 2021-09-04 ENCOUNTER — Encounter: Payer: Self-pay | Admitting: Family Medicine

## 2021-09-06 ENCOUNTER — Telehealth: Payer: Self-pay

## 2021-09-06 NOTE — Telephone Encounter (Signed)
Prior auth approved for tramadol-acetaminophen till 10/03/21.

## 2021-09-08 ENCOUNTER — Other Ambulatory Visit: Payer: Self-pay | Admitting: Family Medicine

## 2021-09-13 ENCOUNTER — Other Ambulatory Visit: Payer: Self-pay | Admitting: Family Medicine

## 2021-09-13 DIAGNOSIS — M170 Bilateral primary osteoarthritis of knee: Secondary | ICD-10-CM

## 2021-09-14 ENCOUNTER — Ambulatory Visit (INDEPENDENT_AMBULATORY_CARE_PROVIDER_SITE_OTHER): Payer: 59

## 2021-09-14 ENCOUNTER — Other Ambulatory Visit: Payer: Self-pay

## 2021-09-14 DIAGNOSIS — E291 Testicular hypofunction: Secondary | ICD-10-CM | POA: Diagnosis not present

## 2021-09-14 NOTE — Progress Notes (Signed)
Pt in office for testosterone injection. Pt tolerated.   Lorita Officer, CCMA 09/14/21 11:13 AM

## 2021-09-19 DIAGNOSIS — M170 Bilateral primary osteoarthritis of knee: Secondary | ICD-10-CM | POA: Insufficient documentation

## 2021-09-19 DIAGNOSIS — F331 Major depressive disorder, recurrent, moderate: Secondary | ICD-10-CM | POA: Insufficient documentation

## 2021-09-19 MED ORDER — FLUOXETINE HCL 40 MG PO CAPS: 40.0000 mg | ORAL_CAPSULE | Freq: Every day | ORAL | 3 refills | Status: AC

## 2021-09-19 NOTE — Assessment & Plan Note (Signed)
Healthy diet.  Unable to exercise.  Recommend adjust mounjaro as above.

## 2021-09-19 NOTE — Assessment & Plan Note (Signed)
Continue prozac. 

## 2021-09-19 NOTE — Assessment & Plan Note (Signed)
Work aggressively on weight loss.  Needs TKR.

## 2021-09-19 NOTE — Assessment & Plan Note (Signed)
Increase mounjaro to 7.5 mg weekly upon completion of 5 mg weekly.

## 2021-09-19 NOTE — Assessment & Plan Note (Signed)
At goal.  Continue lipitor 20 mg 1 tablet once daily. Recommend continue to work on eating healthy diet. Unable to exercise currently.

## 2021-09-25 ENCOUNTER — Other Ambulatory Visit: Payer: Self-pay | Admitting: Family Medicine

## 2021-09-25 DIAGNOSIS — M170 Bilateral primary osteoarthritis of knee: Secondary | ICD-10-CM

## 2021-09-28 ENCOUNTER — Ambulatory Visit (INDEPENDENT_AMBULATORY_CARE_PROVIDER_SITE_OTHER): Payer: 59

## 2021-09-28 DIAGNOSIS — E291 Testicular hypofunction: Secondary | ICD-10-CM | POA: Diagnosis not present

## 2021-10-04 ENCOUNTER — Other Ambulatory Visit: Payer: Self-pay | Admitting: Family Medicine

## 2021-10-06 ENCOUNTER — Other Ambulatory Visit: Payer: Self-pay | Admitting: Family Medicine

## 2021-10-12 ENCOUNTER — Other Ambulatory Visit: Payer: Self-pay

## 2021-10-12 ENCOUNTER — Ambulatory Visit (INDEPENDENT_AMBULATORY_CARE_PROVIDER_SITE_OTHER): Payer: 59

## 2021-10-12 DIAGNOSIS — E291 Testicular hypofunction: Secondary | ICD-10-CM | POA: Diagnosis not present

## 2021-10-12 NOTE — Progress Notes (Signed)
   Testosterone injection given per order, patient tolerated well.   Jacklynn Bue, LPN 35:32 AM

## 2021-10-15 ENCOUNTER — Other Ambulatory Visit: Payer: Self-pay | Admitting: Family Medicine

## 2021-10-26 ENCOUNTER — Ambulatory Visit (INDEPENDENT_AMBULATORY_CARE_PROVIDER_SITE_OTHER): Payer: BLUE CROSS/BLUE SHIELD

## 2021-10-26 ENCOUNTER — Other Ambulatory Visit: Payer: Self-pay | Admitting: Family Medicine

## 2021-10-26 DIAGNOSIS — E291 Testicular hypofunction: Secondary | ICD-10-CM

## 2021-10-26 NOTE — Progress Notes (Signed)
Patient in office for testosterone injection. Pt tolerated in L hip.   Lorita Officer, CCMA 10/26/21 10:07 AM

## 2021-11-08 ENCOUNTER — Other Ambulatory Visit: Payer: Self-pay | Admitting: Family Medicine

## 2021-11-09 ENCOUNTER — Ambulatory Visit (INDEPENDENT_AMBULATORY_CARE_PROVIDER_SITE_OTHER): Payer: BLUE CROSS/BLUE SHIELD

## 2021-11-09 ENCOUNTER — Other Ambulatory Visit: Payer: Self-pay

## 2021-11-09 DIAGNOSIS — E291 Testicular hypofunction: Secondary | ICD-10-CM

## 2021-11-09 NOTE — Progress Notes (Signed)
° °  Testosterone injection given per order, patient tolerated well.   Jacklynn Bue, LPN 79:72 AM

## 2021-11-15 ENCOUNTER — Other Ambulatory Visit: Payer: Self-pay | Admitting: Physician Assistant

## 2021-11-16 ENCOUNTER — Other Ambulatory Visit: Payer: Self-pay | Admitting: Family Medicine

## 2021-11-17 ENCOUNTER — Other Ambulatory Visit: Payer: Self-pay | Admitting: Family Medicine

## 2021-11-17 DIAGNOSIS — M170 Bilateral primary osteoarthritis of knee: Secondary | ICD-10-CM

## 2021-11-18 ENCOUNTER — Ambulatory Visit: Payer: BLUE CROSS/BLUE SHIELD | Admitting: Legal Medicine

## 2021-12-08 ENCOUNTER — Ambulatory Visit: Payer: 59 | Admitting: Family Medicine

## 2021-12-17 ENCOUNTER — Other Ambulatory Visit: Payer: Self-pay | Admitting: Family Medicine

## 2021-12-23 ENCOUNTER — Encounter: Payer: Self-pay | Admitting: Family Medicine

## 2021-12-23 ENCOUNTER — Other Ambulatory Visit: Payer: Self-pay | Admitting: Family Medicine

## 2021-12-23 DIAGNOSIS — M170 Bilateral primary osteoarthritis of knee: Secondary | ICD-10-CM

## 2022-01-01 ENCOUNTER — Other Ambulatory Visit: Payer: Self-pay | Admitting: Family Medicine

## 2022-01-10 ENCOUNTER — Other Ambulatory Visit: Payer: Self-pay | Admitting: Family Medicine

## 2022-01-10 DIAGNOSIS — M170 Bilateral primary osteoarthritis of knee: Secondary | ICD-10-CM

## 2022-01-14 ENCOUNTER — Other Ambulatory Visit: Payer: Self-pay | Admitting: Family Medicine

## 2022-02-10 ENCOUNTER — Other Ambulatory Visit: Payer: Self-pay | Admitting: Family Medicine

## 2022-02-10 DIAGNOSIS — E1159 Type 2 diabetes mellitus with other circulatory complications: Secondary | ICD-10-CM

## 2022-04-02 ENCOUNTER — Other Ambulatory Visit: Payer: Self-pay | Admitting: Family Medicine

## 2022-05-01 ENCOUNTER — Other Ambulatory Visit: Payer: Self-pay | Admitting: Legal Medicine

## 2022-05-01 DIAGNOSIS — I152 Hypertension secondary to endocrine disorders: Secondary | ICD-10-CM
